# Patient Record
Sex: Female | Born: 1960 | Race: White | Hispanic: No | Marital: Married | Smoking: Never smoker
Health system: Southern US, Community
[De-identification: ages and names within clinical notes are randomized; demographics above are authoritative.]

## PROBLEM LIST (undated history)

## (undated) DIAGNOSIS — I10 Essential (primary) hypertension: Secondary | ICD-10-CM

## (undated) DIAGNOSIS — K219 Gastro-esophageal reflux disease without esophagitis: Secondary | ICD-10-CM

## (undated) DIAGNOSIS — E079 Disorder of thyroid, unspecified: Secondary | ICD-10-CM

## (undated) DIAGNOSIS — D751 Secondary polycythemia: Secondary | ICD-10-CM

## (undated) DIAGNOSIS — G4730 Sleep apnea, unspecified: Secondary | ICD-10-CM

## (undated) DIAGNOSIS — E039 Hypothyroidism, unspecified: Secondary | ICD-10-CM

## (undated) DIAGNOSIS — D649 Anemia, unspecified: Secondary | ICD-10-CM

## (undated) DIAGNOSIS — M1990 Unspecified osteoarthritis, unspecified site: Secondary | ICD-10-CM

## (undated) DIAGNOSIS — E785 Hyperlipidemia, unspecified: Secondary | ICD-10-CM

## (undated) DIAGNOSIS — J45909 Unspecified asthma, uncomplicated: Secondary | ICD-10-CM

## (undated) HISTORY — DX: Unspecified osteoarthritis, unspecified site: M19.90

## (undated) HISTORY — DX: Hyperlipidemia, unspecified: E78.5

## (undated) HISTORY — DX: Essential (primary) hypertension: I10

## (undated) HISTORY — DX: Sleep apnea, unspecified: G47.30

## (undated) HISTORY — PX: OTHER SURGICAL HISTORY: SHX169

## (undated) HISTORY — DX: Anemia, unspecified: D64.9

## (undated) HISTORY — DX: Disorder of thyroid, unspecified: E07.9

## (undated) HISTORY — PX: TUBAL LIGATION: SHX77

## (undated) HISTORY — DX: Secondary polycythemia: D75.1

---

## 2005-10-30 ENCOUNTER — Ambulatory Visit: Payer: Self-pay | Admitting: Occupational Therapy

## 2006-11-05 HISTORY — PX: COLONOSCOPY WITH ESOPHAGOGASTRODUODENOSCOPY (EGD): SHX5779

## 2006-11-27 ENCOUNTER — Ambulatory Visit: Payer: Self-pay | Admitting: Nurse Practitioner

## 2007-05-08 ENCOUNTER — Ambulatory Visit: Payer: Self-pay | Admitting: Gastroenterology

## 2007-05-26 ENCOUNTER — Ambulatory Visit: Payer: Self-pay | Admitting: Gastroenterology

## 2007-05-26 ENCOUNTER — Ambulatory Visit (HOSPITAL_COMMUNITY): Admission: RE | Admit: 2007-05-26 | Discharge: 2007-05-26 | Payer: Self-pay | Admitting: Gastroenterology

## 2007-05-26 ENCOUNTER — Encounter: Payer: Self-pay | Admitting: Gastroenterology

## 2007-12-18 ENCOUNTER — Ambulatory Visit: Payer: Self-pay | Admitting: Nurse Practitioner

## 2007-12-30 ENCOUNTER — Ambulatory Visit: Payer: Self-pay | Admitting: Nurse Practitioner

## 2008-12-24 ENCOUNTER — Ambulatory Visit: Payer: Self-pay | Admitting: Nurse Practitioner

## 2010-06-05 ENCOUNTER — Ambulatory Visit: Payer: Self-pay | Admitting: Nurse Practitioner

## 2011-03-20 NOTE — Op Note (Signed)
[name], Foley NO.:  [id number]   MEDICAL RECORD NO.:  [id number]          PATIENT TYPE:  AMB   LOCATION:  DAY                           FACILITY:  [facility]   PHYSICIAN:  Kassie Mends, M.D.      DATE OF BIRTH:  February 02, 1961   DATE OF PROCEDURE:  05/26/2007  DATE OF DISCHARGE:                               OPERATIVE REPORT   REFERRING Christine Foley:  Ninfa Linden, nurse practitioner, of  [place], [place].   PROCEDURES:  1. Ileocolonoscopy.  2. Esophagogastroduodenoscopy with biopsies of the esophagus, stomach      and duodenum.   INDICATION FOR EXAM:  Christine Foley is a 50 year old female who has a  reported history of pernicious anemia.  She presented with a hemoglobin  of 13.5 and an MCV of 77.  Her ferritin was 8.  She still menstruates  and has 3/5 days of heavy bleeding.  She has been taking ibuprofen 100  mg twice daily for back pain and occasionally takes Goody powders.   FINDINGS:  1. The scope advanced at approximately 10-20 cm into the distal      terminal ileum.  The terminal ileum was normal.  2. Normal colon without evidence of polyps, masses, inflammatory      changes, diverticula, AVMs or hemorrhoids.  3. Sessile polypoid-appearing lesion in the mid esophagus removed via      cold forceps.  4. Normal appearing gastric mucosa.  Biopsies obtained to evaluate for      atrophic gastritis as an etiology for her inability to absorb iron.  5. Normal duodenal bulb and second and third portion of the duodenum.      Biopsies obtained to evaluate for celiac sprue.   RECOMMENDATIONS:  1. No aspirin, NSAIDs or anticoagulation for five days.  2. Resume previous diet.  3. Continue iron supplementation.  4. Will call Christine Foley with results of her biopsies.  5. Screening colonoscopy in 10 years.   MEDICATIONS:  1. Demerol 100 mg IV.  2. Versed 8 mg IV.   PROCEDURE TECHNIQUE:  Physical exam was performed.  Informed consent was  obtained from  the patient explaining the benefits, risks and  alternatives to procedure.  The patient was connected to the monitor and  placed in the left lateral position.  Continuous oxygen was provided by  nasal cannula and IV medicine administered through an indwelling  cannula.  After administration of sedation and rectal exam, the  patient's rectum was intubated and the scope was advanced under direct  visualization to the terminal ileum.  The scope was withdrawn slowly by  carefully examining the color, texture, anatomy and integrity mucosa on  the way out.   After the colonoscopy, the patient's esophagus was intubated with a  diagnostic gastroscope.  The scope was advanced under direct  visualization to the third portion of the duodenum.  The scope was  withdrawn slowly by carefully examining the color, texture, anatomy and  integrity of the mucosa on the way out.  The patient was recovered in  endoscopy and discharged home in satisfactory condition.  Kassie Mends, M.D.  Electronically Signed     SM/MEDQ  D:  05/26/2007  T:  05/26/2007  Job:  [id number]   cc:   Ninfa Linden, N.P.  [place], [place]

## 2011-03-20 NOTE — Consult Note (Signed)
Christine Foley, Christine Foley             ACCOUNT NO.:  [id number]   MEDICAL RECORD NO.:  [id number]          PATIENT TYPE:  AMB   LOCATION:  DAY                           FACILITY:  [facility]   PHYSICIAN:  Kassie Mends, M.D.      DATE OF BIRTH:  04-May-1961   DATE OF CONSULTATION:  05/08/2007  DATE OF DISCHARGE:                                 CONSULTATION   REQUESTING PHYSICIAN:  Ninfa Linden, nurse practitioner.   CHIEF COMPLAINT:  Iron deficiency.   HISTORY OF PRESENT ILLNESS:  Christine Foley is a 50 year old female with a  1-year history of being diagnosed with anemia.  She was found to have  pernicious anemia and was started on B12 injections approximately 1 year  ago.  Her hemoglobin has normalized.  However, she continues to have  microcytic indices as well as a low serum ferritin of 8.  She has a  hemoglobin of 13.3 and a hematocrit 40.3 with an MCV of 77.  She denies  any problems with rectal bleeding or melena.  Denies any abdominal pain,  nausea or vomiting.  Her last menstrual period was June 23.  She does  have 3 out of 5 days with menorrhagia.  She has been on iron for the  last month.  She does have indigestion on a daily basis.  She takes over-  the-counter Tums for this.  She has not been on PPI recently, although  she did have an endoscopic evaluation approximately 4 years ago in  [place], [place], and tells me this was benign.  She denies any  history of blood donations.  She is not a vegetarian and does consume  meat.  She has been taking ibuprofen on a regular basis 800 mg b.i.d.  for chronic back pain.  She occasionally takes a Advertising account executive but not  with the ibuprofen.  She denies any dysphagia, odynophagia, anorexia or  early satiety.  Occasionally she will have constipation but denies any  diarrhea.   PAST MEDICAL AND SURGICAL HISTORY:  1. Chronic low back pain for almost 20 years now.  2. She has history of degenerative disk disease.  3. Hypertension.  4. GERD.  5. Sleep apnea.  6. Asthma.  7. She has had status post two C-sections.  8. She has had a right carpal tunnel release.   CURRENT MEDICATIONS:  1. Benazepril/HCl  20/12.5 mg daily.  2. Allegra 180 mg daily.  3. Albuterol inhaler p.r.n.  4. Iron 65 mg daily.  5. Ibuprofen 800 mg b.i.d.   ALLERGIES:  PENICILLIN.   FAMILY HISTORY:  There is no known family history of colorectal  carcinoma, liver or chronic GI problems, although she does report her  father, age 57, has a history of anemia.   SOCIAL HISTORY:  Christine Foley is married.  She presses uniforms.  She has  two grown healthy children.  She denies any tobacco or drug use.  She  rarely consumes alcohol.   REVIEW OF SYSTEMS:  CARDIOVASCULAR:  She does have palpitations.  Denies  any chest pain.  Otherwise negative review of systems.  See HPI.  PHYSICAL EXAM:  VITAL SIGNS:  Weight 249-1/2 pounds, height 59 inches.  Temperature 97.8, blood pressure 120/90, and pulse 76.  GENERAL:  Christine Foley is an obese Caucasian female who is alert  oriented, pleasant and cooperative, in no acute distress.  HEENT.  Sclerae are clear, nonicteric.  Conjunctivae pink.  Oropharynx pink and  moist without any lesions.  NECK:  Supple without any mass or  thyromegaly.  CHEST:  Heart regular rate and rhythm.  Normal S1-S2  without murmurs, rubs, thrills or gallops.  LUNGS:  Clear to  auscultation bilaterally.  ABDOMEN:  Protuberant with positive bowel  sounds x4.  No bruits auscultated.  Soft, nontender, nondistended,  without palpable mass or hepatosplenomegaly.  No rebound tenderness or  guarding.  Exam is limited given the patient's body habitus.  Rectal was deferred.  EXTREMITIES:  Without clubbing or edema  bilaterally.  SKIN:  Pink, warm and dry without rash or jaundice   IMPRESSION:  Christine Foley is a 50 year old female with history of low MCV  and low ferritin despite normal hemoglobin.  She has a history of B12  deficiency and has been on  monthly injections for approximately a year  now.  She has a normal TSH.  She appears to be iron-deficient and is  going to need a further workup to rule out occult malignancy or slow  gastrointestinal bleed.  She is on daily nonsteroidal anti-inflammatory  drug use, placing her at risk for gradual gastrointestinal bleeding.  She also has gastroesophageal reflux disease symptoms and is not on  proton pump inhibitor.   PLAN:  1. Begin Prilosec 20 mg daily.  2. Colonoscopy plus/minus EGD with Dr. Cira Servant in the near future.  I      have discussed this procedure with both the patient and Dr. Cira Servant      including risks and benefits, which include but are not limited to      bleeding, infection, perforation, and drug reaction.  She agrees.      A signed consent will be obtained.  She was told to hold her iron      for a week prior to the procedure.  No ibuprofen for 3 days prior      to the procedure.   Thank you, Ninfa Linden, nurse practitioner, for allowing [place] to  participate in the care of Christine Foley.      Lorenza Burton, N.P.      Kassie Mends, M.D.  Electronically Signed   KJ/MEDQ  D:  05/08/2007  T:  05/09/2007  Job:  [id number]   cc:   Ninfa Linden, NP  [place], [place]

## 2011-07-11 ENCOUNTER — Ambulatory Visit: Payer: Self-pay | Admitting: Nurse Practitioner

## 2011-08-20 LAB — FERRITIN: Ferritin: 27 (ref 10–291)

## 2012-07-14 ENCOUNTER — Ambulatory Visit: Payer: Self-pay | Admitting: Nurse Practitioner

## 2013-07-21 ENCOUNTER — Ambulatory Visit: Payer: Self-pay

## 2014-09-07 ENCOUNTER — Ambulatory Visit: Payer: Self-pay | Admitting: Physician Assistant

## 2015-03-21 ENCOUNTER — Encounter (HOSPITAL_COMMUNITY): Payer: Self-pay | Admitting: Oncology

## 2015-03-21 ENCOUNTER — Encounter (HOSPITAL_COMMUNITY): Payer: BLUE CROSS/BLUE SHIELD | Attending: Oncology | Admitting: Oncology

## 2015-03-21 VITALS — BP 120/83 | HR 84 | Temp 98.0°F | Resp 18 | Ht 58.5 in | Wt 265.4 lb

## 2015-03-21 DIAGNOSIS — N939 Abnormal uterine and vaginal bleeding, unspecified: Secondary | ICD-10-CM

## 2015-03-21 DIAGNOSIS — I10 Essential (primary) hypertension: Secondary | ICD-10-CM | POA: Diagnosis not present

## 2015-03-21 DIAGNOSIS — J45909 Unspecified asthma, uncomplicated: Secondary | ICD-10-CM | POA: Insufficient documentation

## 2015-03-21 DIAGNOSIS — Z6841 Body Mass Index (BMI) 40.0 and over, adult: Secondary | ICD-10-CM | POA: Insufficient documentation

## 2015-03-21 DIAGNOSIS — E039 Hypothyroidism, unspecified: Secondary | ICD-10-CM | POA: Diagnosis not present

## 2015-03-21 DIAGNOSIS — D751 Secondary polycythemia: Secondary | ICD-10-CM | POA: Insufficient documentation

## 2015-03-21 DIAGNOSIS — E785 Hyperlipidemia, unspecified: Secondary | ICD-10-CM | POA: Insufficient documentation

## 2015-03-21 LAB — COMPREHENSIVE METABOLIC PANEL
ALBUMIN: 4.2 g/dL (ref 3.5–5.0)
ALT: 21 U/L (ref 14–54)
AST: 25 U/L (ref 15–41)
Alkaline Phosphatase: 64 U/L (ref 38–126)
Anion gap: 11 (ref 5–15)
BILIRUBIN TOTAL: 1.1 mg/dL (ref 0.3–1.2)
BUN: 13 mg/dL (ref 6–20)
CO2: 28 mmol/L (ref 22–32)
Calcium: 9.2 mg/dL (ref 8.9–10.3)
Chloride: 99 mmol/L — ABNORMAL LOW (ref 101–111)
Creatinine, Ser: 0.7 mg/dL (ref 0.44–1.00)
GFR calc Af Amer: >60 mL/min (ref >60)
GFR calc non Af Amer: >60 mL/min (ref >60)
GLUCOSE: 106 mg/dL — AB (ref 65–99)
POTASSIUM: 3 mmol/L — AB (ref 3.5–5.1)
Sodium: 138 mmol/L (ref 135–145)
Total Protein: 7.4 g/dL (ref 6.5–8.1)

## 2015-03-21 LAB — IRON AND TIBC
Iron: 48 ug/dL (ref 28–170)
Saturation Ratios: 14 % (ref 10.4–31.8)
TIBC: 337 ug/dL (ref 250–450)
UIBC: 289 ug/dL

## 2015-03-21 LAB — TSH: TSH: 1.475 u[IU]/mL (ref 0.350–4.500)

## 2015-03-21 LAB — VITAMIN B12: Vitamin B-12: 859 pg/mL (ref 180–914)

## 2015-03-21 LAB — FERRITIN: Ferritin: 118 ng/mL (ref 11–307)

## 2015-03-21 NOTE — Patient Instructions (Signed)
[place] at [facility] Discharge Instructions  RECOMMENDATIONS MADE BY THE CONSULTANT AND ANY TEST RESULTS WILL BE SENT TO YOUR REFERRING PHYSICIAN.  Exam and discussion by Robynn Pane, PA-C and Dr. Ancil Linsey Will check some labs. Make referrals for: Sleep Study Gynecologic consult Will see you back in 3 - 4 weeks to discuss results.   Thank you for choosing [place] at [facility] to provide your oncology and hematology care.  To afford each patient quality time with our provider, please arrive at least 15 minutes before your scheduled appointment time.    You need to re-schedule your appointment should you arrive 10 or more minutes late.  We strive to give you quality time with our providers, and arriving late affects you and other patients whose appointments are after yours.  Also, if you no show three or more times for appointments you may be dismissed from the clinic at the providers discretion.     Again, thank you for choosing [facility].  Our hope is that these requests will decrease the amount of time that you wait before being seen by our physicians.       _____________________________________________________________  Should you have questions after your visit to [facility], please contact our office at [phone] between the hours of 8:30 a.m. and 4:30 p.m.  Voicemails left after 4:30 p.m. will not be returned until the following business day.  For prescription refill requests, have your pharmacy contact our office.

## 2015-03-21 NOTE — Progress Notes (Addendum)
[facility] Hematology/Oncology Consultation   Name: VALERA VALLAS      MRN: [id number]    Location: Room/bed info not found  Date: 03/21/2015 Time:4:05 PM   REFERRING PHYSICIAN:  Karna Dupes, PA-C; [place] CONSULT:  Polycythemia   DIAGNOSIS:  Polycythemia  HISTORY OF PRESENT ILLNESS:   Mrs. Sumler is a 54 year old white American female with a past medical history significant for asthma, seasonal allergies, HTN, Hyperlipidemia, Hypothyroidism, history of iron deficiency anemia, and left knee arthritis who was referred to the [facility] for an elevated hemoglobin.  The patient reports that she saw Mr. Alford Highland, PA-C for a 1 week history of dizziness without episodes of LOC.  This was complicated by hot flashes and other constitutional symptoms including headaches and sinus pressure behind her eyes.  Her labs demonstrated an elevated Hgb and she was provided some education regarding polycythemia and once she learned about it, her symptoms spontaneously resolved.    Interestingly, she notes a 3 month history of vaginal bleeding ranging from vaginal spotting to heavy bleeding with clots.  She notes that her vaginal bleeding has been worse in the past months.  She has not seen a Editor, commissioning about this.  She reports that her last internal vaginal exam was 3 years ago by her primary care provider.  At that time, she reports that her exam was benign.    She notes that 8 years ago, she was iron deficient and was started on PO iron.  She is married and has been married for 20 years.  She admits that her husband reports that she snores.  She notes that's she does not feel rested upon waking in the AM.  She denies that her husband reports that she does not stop breathing during sleep.  I personally reviewed and went over laboratory results with the patient.  The results are noted within this dictation.  I personally reviewed and went over  radiographic studies with the patient.  The results are noted within this dictation.    She reports that she is up-to-date on colonoscopy and mammogram.   PAST MEDICAL HISTORY:   Past Medical History  Diagnosis Date  . Arthritis     ALLERGIES: Allergies  Allergen Reactions  . Penicillins Shortness Of Breath      MEDICATIONS: I have reviewed the patient's current medications.    No current outpatient prescriptions on file prior to visit.   No current facility-administered medications on file prior to visit.     PAST SURGICAL HISTORY Past Surgical History  Procedure Laterality Date  . Cesarean section      x 2    FAMILY HISTORY: Family History  Problem Relation Age of Onset  . Diabetes Mother   . Hypertension Father     SOCIAL HISTORY:  reports that she has never smoked. She has never used smokeless tobacco. She reports that she does not drink alcohol or use illicit drugs.  PERFORMANCE STATUS: The patient's performance status is 1 - Symptomatic but completely ambulatory  PHYSICAL EXAM: Most Recent Vital Signs: Blood pressure 120/83, pulse 84, temperature 98 F (36.7 C), temperature source Oral, resp. rate 18, height 4' 10.5" (1.486 m), weight 265 lb 6.4 oz (120.385 kg), SpO2 99 %. General appearance: alert, cooperative, appears stated age, no distress and morbidly obese Head: Normocephalic, without obvious abnormality, atraumatic Eyes: conjunctivae/corneas clear. PERRL, EOM's intact. Fundi benign. Throat: lips, mucosa, and tongue normal;  teeth and gums normal Neck: no adenopathy, supple, symmetrical, trachea midline and thyroid not enlarged, symmetric, no tenderness/mass/nodules Lungs: clear to auscultation bilaterally and normal percussion bilaterally Heart: regular rate and rhythm, S1, S2 normal, no murmur, click, rub or gallop Abdomen: normal findings: bowel sounds normal, no masses palpable, no organomegaly, soft, non-tender, spleen non-palpable, symmetric and  umbilicus normal and abnormal findings:  obese Extremities: extremities normal, atraumatic, no cyanosis or edema Skin: Skin color, texture, turgor normal. No rashes or lesions Lymph nodes: Cervical, supraclavicular, and axillary nodes normal. Neurologic: Alert and oriented X 3, normal strength and tone. Normal symmetric reflexes. Normal coordination and gait  LABORATORY DATA:  No results found for this or any previous visit (from the past 48 hour(s)).    RADIOGRAPHY: No results found.     PATHOLOGY:  Nothing  ASSESSMENT:  1. Polycythemia with elevated HCT, mild. 2. Vaginal bleeding, x 3 months 3. HTN 4. Hyperlipidemia 5. Asthma 6. Seasonal allergies 7. Morbid obesity 8. Hypothyroidism   PLAN:  1. I personally reviewed and went over laboratory results with the patient.  The results are noted within this dictation. 2. Chart reviewed 3. Labs: CBC diff, CMET, EPO, B12, iron/TIBC, ferritin, TSH, JAK2, and JAK2 Exon 12 and 13. 4. Sleep study 5. Referral to gynecologist for 3 month history of heavy bleeding. 6. Return in 3-4 weeks for follow-up.  All questions were answered. The patient knows to call the clinic with any problems, questions or concerns. We can certainly see the patient much sooner if necessary.  More than 50% of the time spent with the patient was utilized for counseling and coordination of care.  This note is electronically signed by: Robynn Pane 03/21/2015 4:05 PM   Patient is seen and examined. Details are as above.She will be referred for a sleep study given history and body habitus. Given her gynecologic complains she will be referred to gynecology.  We will repeat labs today and send off additional studies as detailed above. She does not have symptoms c/w primary P. Vera. I will see her back once all results are available for additional recommendations and review.  Donald Pore MD

## 2015-03-24 ENCOUNTER — Other Ambulatory Visit (HOSPITAL_COMMUNITY): Payer: Self-pay

## 2015-03-24 ENCOUNTER — Encounter (HOSPITAL_COMMUNITY): Payer: BLUE CROSS/BLUE SHIELD | Attending: Hematology & Oncology

## 2015-03-24 DIAGNOSIS — D751 Secondary polycythemia: Secondary | ICD-10-CM | POA: Diagnosis not present

## 2015-03-24 DIAGNOSIS — D582 Other hemoglobinopathies: Secondary | ICD-10-CM | POA: Diagnosis not present

## 2015-03-24 LAB — CBC WITH DIFFERENTIAL/PLATELET
BASOS ABS: 0 10*3/uL (ref 0.0–0.1)
Basophils Relative: 0 % (ref 0–1)
EOS ABS: 0.3 10*3/uL (ref 0.0–0.7)
Eosinophils Relative: 3 % (ref 0–5)
HEMATOCRIT: 44.5 % (ref 36.0–46.0)
HEMOGLOBIN: 14.8 g/dL (ref 12.0–15.0)
LYMPHS ABS: 2.3 10*3/uL (ref 0.7–4.0)
Lymphocytes Relative: 29 % (ref 12–46)
MCH: 28.3 pg (ref 26.0–34.0)
MCHC: 33.3 g/dL (ref 30.0–36.0)
MCV: 85.1 fL (ref 78.0–100.0)
Monocytes Absolute: 0.4 10*3/uL (ref 0.1–1.0)
Monocytes Relative: 5 % (ref 3–12)
NEUTROS PCT: 63 % (ref 43–77)
Neutro Abs: 5 10*3/uL (ref 1.7–7.7)
Platelets: 302 10*3/uL (ref 150–400)
RBC: 5.23 MIL/uL — AB (ref 3.87–5.11)
RDW: 12.9 % (ref 11.5–15.5)
WBC: 8 10*3/uL (ref 4.0–10.5)

## 2015-03-24 NOTE — Progress Notes (Signed)
Labs drawn

## 2015-03-25 LAB — ERYTHROPOIETIN: Erythropoietin: 12.3 m[IU]/mL (ref 2.6–18.5)

## 2015-04-01 LAB — MISCELLANEOUS TEST

## 2015-04-08 ENCOUNTER — Ambulatory Visit: Payer: BLUE CROSS/BLUE SHIELD | Attending: Oncology | Admitting: Sleep Medicine

## 2015-04-08 DIAGNOSIS — D751 Secondary polycythemia: Secondary | ICD-10-CM

## 2015-04-08 DIAGNOSIS — G4733 Obstructive sleep apnea (adult) (pediatric): Secondary | ICD-10-CM | POA: Insufficient documentation

## 2015-04-08 DIAGNOSIS — E669 Obesity, unspecified: Secondary | ICD-10-CM | POA: Insufficient documentation

## 2015-04-08 DIAGNOSIS — R0683 Snoring: Secondary | ICD-10-CM | POA: Diagnosis present

## 2015-04-08 DIAGNOSIS — Z6841 Body Mass Index (BMI) 40.0 and over, adult: Secondary | ICD-10-CM | POA: Diagnosis not present

## 2015-04-11 NOTE — Sleep Study (Signed)
  [place] A. Merlene Laughter, MD     [email or web address]        NOCTURNAL POLYSOMNOGRAM    LOCATION: SLEEP LAB FACILITY: [facility]   PHYSICIAN: Micael Barb A. Merlene Laughter, M.D.   DATE OF STUDY: 04/08/2015.   REFERRING PHYSICIAN: Robynn Pane, PA-C.   INDICATIONS: The patient is a 54 year old who presents with snoring, fatigue and obesity.  MEDICATIONS:  Prior to Admission medications   Medication  Start Date End Date Taking? Authorizing Provider  ALBUTEROL IN Inhale into the lungs as needed.    Historical Provider, MD  benazepril (LOTENSIN) 20 MG tablet Take 20 mg by mouth daily. 03/13/15   Historical Provider, MD  cetirizine (ZYRTEC) 10 MG tablet Take 10 mg by mouth at bedtime as needed for allergies.    Historical Provider, MD  Cholecalciferol (VITAMIN D3) 5000 UNITS CAPS Take 5,000 Units by mouth daily.    Historical Provider, MD  Ferrous Sulfate (IRON) 325 (65 FE) MG TABS Take 65 mg by mouth daily.    Historical Provider, MD  fluticasone (FLONASE) 50 MCG/ACT nasal spray Place 1 spray into both nostrils daily. 02/04/15   Historical Provider, MD  hydrochlorothiazide (HYDRODIURIL) 25 MG tablet Take 25 mg by mouth daily. 01/10/15   Historical Provider, MD  ibuprofen (ADVIL,MOTRIN) 800 MG tablet Take 800 mg by mouth as needed. 01/10/15   Historical Provider, MD  levothyroxine (SYNTHROID, LEVOTHROID) 75 MCG tablet Take 75 mcg by mouth daily. 01/06/15   Historical Provider, MD  Melatonin 5 MG TABS Take 1 tablet by mouth at bedtime.    Historical Provider, MD  omeprazole (PRILOSEC) 20 MG capsule Take 20 mg by mouth daily.    Historical Provider, MD  pravastatin (PRAVACHOL) 20 MG tablet Take 20 mg by mouth daily. 01/06/15   Historical Provider, MD      EPWORTH SLEEPINESS SCALE: 1.   BMI: 55.   ARCHITECTURAL SUMMARY: Total recording time was 474 minutes. Sleep efficiency 57 %. Sleep latency 21 minutes. REM latency 340 minutes. Stage NI 16 %, N2 56 % and N3 15 % and REM sleep 13 %.     RESPIRATORY DATA:  Baseline oxygen saturation is 97 %. The lowest saturation is 79 %. The diagnostic AHI is 20. The RDI is 20. The REM AHI is 63.  LIMB MOVEMENT SUMMARY: PLM index 1.   ELECTROCARDIOGRAM SUMMARY: Average heart rate is 82 with no significant dysrhythmias observed.   IMPRESSION:  1. Moderate obstructive sleep apnea syndrome worse during REM sleep. Formal CPAP titration recording is suggested.  Thanks for this referral.  Amaury Kuzel A. Merlene Laughter, M.D. Diplomat, Tax adviser of Sleep Medicine.

## 2015-04-12 ENCOUNTER — Ambulatory Visit (INDEPENDENT_AMBULATORY_CARE_PROVIDER_SITE_OTHER): Payer: BLUE CROSS/BLUE SHIELD | Admitting: Obstetrics and Gynecology

## 2015-04-12 ENCOUNTER — Other Ambulatory Visit: Payer: Self-pay | Admitting: Obstetrics and Gynecology

## 2015-04-12 ENCOUNTER — Encounter: Payer: Self-pay | Admitting: Obstetrics and Gynecology

## 2015-04-12 VITALS — BP 118/78 | HR 80 | Ht 58.75 in | Wt 265.6 lb

## 2015-04-12 DIAGNOSIS — N950 Postmenopausal bleeding: Secondary | ICD-10-CM | POA: Diagnosis not present

## 2015-04-12 DIAGNOSIS — Z3202 Encounter for pregnancy test, result negative: Secondary | ICD-10-CM

## 2015-04-12 DIAGNOSIS — N924 Excessive bleeding in the premenopausal period: Secondary | ICD-10-CM | POA: Insufficient documentation

## 2015-04-12 DIAGNOSIS — N939 Abnormal uterine and vaginal bleeding, unspecified: Secondary | ICD-10-CM | POA: Diagnosis not present

## 2015-04-12 LAB — POCT URINE PREGNANCY: Preg Test, Ur: NEGATIVE

## 2015-04-12 NOTE — Progress Notes (Signed)
Patient ID: Christine Foley, female   DOB: 02/06/1961, 54 y.o.   MRN: [id number]   [facility] Visit  Patient name: Christine Foley MRN [id number]  Date of birth: 1961-08-13  CC & HPI:  Christine Foley is a 54 y.o. female presenting today for perimenopausal irregular bleeding.   Skipped 11 months at age 89, then had menses x1 then 4 months amenorrhea, has bled almost daily since Feb, not much ROS:  LMP Feb 15 , hasn't stopped since. Has bled off and on since.   Pertinent History Reviewed:   Reviewed: Significant for last pap 2013, always normal paps.  Medical         Past Medical History  Diagnosis Date  . Arthritis                               Surgical Hx:    Past Surgical History  Procedure Laterality Date  . Cesarean section      x 2  No obstetric history on file. G2P2  Medications : Reviewed & Updated - see associated section                       Current outpatient prescriptions:  .  ALBUTEROL IN, Inhale into the lungs as needed., Disp: , Rfl:  .  benazepril (LOTENSIN) 20 MG tablet, Take 20 mg by mouth daily., Disp: , Rfl: 1 .  cetirizine (ZYRTEC) 10 MG tablet, Take 10 mg by mouth at bedtime as needed for allergies., Disp: , Rfl:  .  Cholecalciferol (VITAMIN D3) 5000 UNITS CAPS, Take 5,000 Units by mouth daily., Disp: , Rfl:  .  Ferrous Sulfate (IRON) 325 (65 FE) MG TABS, Take 65 mg by mouth daily., Disp: , Rfl:  .  fluticasone (FLONASE) 50 MCG/ACT nasal spray, Place 1 spray into both nostrils daily., Disp: , Rfl: 0 .  hydrochlorothiazide (HYDRODIURIL) 25 MG tablet, Take 25 mg by mouth daily., Disp: , Rfl: 0 .  ibuprofen (ADVIL,MOTRIN) 800 MG tablet, Take 800 mg by mouth as needed., Disp: , Rfl: 0 .  levothyroxine (SYNTHROID, LEVOTHROID) 75 MCG tablet, Take 75 mcg by mouth daily., Disp: , Rfl: 0 .  omeprazole (PRILOSEC) 20 MG capsule, Take 20 mg by mouth daily., Disp: , Rfl:  .  pravastatin (PRAVACHOL) 20 MG tablet, Take 20 mg by mouth daily., Disp: , Rfl:  0 .  Melatonin 5 MG TABS, Take 1 tablet by mouth at bedtime., Disp: , Rfl:    Social History: Reviewed -  reports that she has never smoked. She has never used smokeless tobacco.  Objective Findings:  Vitals: Blood pressure 118/78, pulse 80, height 4' 10.75" (1.492 m), weight 265 lb 9.6 oz (120.475 kg), last menstrual period 12/20/2014.  Physical Examination: General appearance - alert, well appearing, and in no distress, oriented to person, place, and time and overweight Mental status - alert, oriented to person, place, and time, normal mood, behavior, speech, dress, motor activity, and thought processes Abdomen - soft, nontender, nondistended, no masses or organomegaly Pelvic - normal external genitalia, vulva, vagina, cervix, uterus and adnexa, VULVA: normal appearing vulva with no masses, tenderness or lesions,  , VAGINA: normal appearing vagina with normal color and discharge, no lesions, PELVIC FLOOR EXAM: no cystocele, rectocele or prolapse noted Good support.  PROCEDURE NOTE: TIMEOUT AND CONSENTED FOR ENDOMETRIAL BIOPSY. UTERUS SOUNDED TO 9 CM, WITH BLOOD AND SOME TISSUE CHUNKS  OBTAINED. It feels like there may be a polyp..   Assessment & Plan:   A:  1. POstmenopausal bleeding 2 Hx chronic anovulation  P:  1. F/u endo bx 2. Order pelvic u/s 3 review results at u/s appt

## 2015-04-20 ENCOUNTER — Ambulatory Visit (INDEPENDENT_AMBULATORY_CARE_PROVIDER_SITE_OTHER): Payer: BLUE CROSS/BLUE SHIELD

## 2015-04-20 ENCOUNTER — Encounter: Payer: Self-pay | Admitting: Obstetrics and Gynecology

## 2015-04-20 ENCOUNTER — Ambulatory Visit (INDEPENDENT_AMBULATORY_CARE_PROVIDER_SITE_OTHER): Payer: BLUE CROSS/BLUE SHIELD | Admitting: Obstetrics and Gynecology

## 2015-04-20 ENCOUNTER — Other Ambulatory Visit: Payer: BLUE CROSS/BLUE SHIELD

## 2015-04-20 VITALS — BP 124/78 | Ht <= 58 in | Wt 264.5 lb

## 2015-04-20 DIAGNOSIS — N924 Excessive bleeding in the premenopausal period: Secondary | ICD-10-CM | POA: Diagnosis not present

## 2015-04-20 DIAGNOSIS — N888 Other specified noninflammatory disorders of cervix uteri: Secondary | ICD-10-CM

## 2015-04-20 DIAGNOSIS — R938 Abnormal findings on diagnostic imaging of other specified body structures: Secondary | ICD-10-CM | POA: Diagnosis not present

## 2015-04-20 DIAGNOSIS — N854 Malposition of uterus: Secondary | ICD-10-CM | POA: Diagnosis not present

## 2015-04-20 DIAGNOSIS — N950 Postmenopausal bleeding: Secondary | ICD-10-CM

## 2015-04-20 MED ORDER — MEDROXYPROGESTERONE ACETATE 5 MG PO TABS: 5.0000 mg | ORAL_TABLET | Freq: Every day | ORAL | Status: DC

## 2015-04-20 NOTE — Progress Notes (Signed)
Patient ID: Christine Foley, female   DOB: 1960/12/15, 54 y.o.   MRN: [id number] Pt here today for follow up and to get results from biopsy and [place].  Biopsy: proliferative endometrium U/s poor u/s quality due to the scar from the c/s causing shadowing Adnexa negative' U/s ?thickened to 11 mm of endometrial area.  BP 124/78 mmHg  Ht 4\' 10"  (1.473 m)  Wt 264 lb 8 oz (119.976 kg)  BMI 55.30 kg/m2  LMP 12/20/2014 . Assess perimenopausal bleeding with negative endo bx Plan" provera 5 x 14 d/ month x 3 months then review menstrual calendar. Reassess in October. Consider hysteroscopy for suspected polyp if bleeding persists.

## 2015-04-20 NOTE — Progress Notes (Addendum)
[place] T/V pelvic:  anteverted uterus wnl,simple nabothian cyst 1.4 x 1.4 x 1.2cm,thickened EEC 14MM,limited view because of pt body habitus,unable to see rt ov, ? lt ov (limited view) wnl

## 2015-04-21 ENCOUNTER — Encounter (HOSPITAL_COMMUNITY): Payer: BLUE CROSS/BLUE SHIELD | Attending: Hematology & Oncology | Admitting: Hematology & Oncology

## 2015-04-21 ENCOUNTER — Encounter (HOSPITAL_COMMUNITY): Payer: Self-pay | Admitting: Hematology & Oncology

## 2015-04-21 VITALS — BP 94/63 | HR 84 | Temp 98.4°F | Resp 16 | Wt 263.2 lb

## 2015-04-21 DIAGNOSIS — E039 Hypothyroidism, unspecified: Secondary | ICD-10-CM | POA: Diagnosis not present

## 2015-04-21 DIAGNOSIS — N939 Abnormal uterine and vaginal bleeding, unspecified: Secondary | ICD-10-CM | POA: Diagnosis not present

## 2015-04-21 DIAGNOSIS — I10 Essential (primary) hypertension: Secondary | ICD-10-CM

## 2015-04-21 DIAGNOSIS — D751 Secondary polycythemia: Secondary | ICD-10-CM

## 2015-04-21 DIAGNOSIS — E876 Hypokalemia: Secondary | ICD-10-CM | POA: Diagnosis not present

## 2015-04-21 DIAGNOSIS — N924 Excessive bleeding in the premenopausal period: Secondary | ICD-10-CM

## 2015-04-21 NOTE — Patient Instructions (Addendum)
[place] at [facility] Discharge Instructions  RECOMMENDATIONS MADE BY THE CONSULTANT AND ANY TEST RESULTS WILL BE SENT TO YOUR REFERRING PHYSICIAN.  Exam and discussion by Dr. Whitney Muse Will make a referral to Dr. Merlene Laughter for your abnormal sleep study.  Follow-up in 3 months with labs and office visit.  Thank you for choosing [place] at [facility] to provide your oncology and hematology care.  To afford each patient quality time with our provider, please arrive at least 15 minutes before your scheduled appointment time.    You need to re-schedule your appointment should you arrive 10 or more minutes late.  We strive to give you quality time with our providers, and arriving late affects you and other patients whose appointments are after yours.  Also, if you no show three or more times for appointments you may be dismissed from the clinic at the providers discretion.     Again, thank you for choosing [facility].  Our hope is that these requests will decrease the amount of time that you wait before being seen by our physicians.       _____________________________________________________________  Should you have questions after your visit to [facility], please contact our office at [phone] between the hours of 8:30 a.m. and 4:30 p.m.  Voicemails left after 4:30 p.m. will not be returned until the following business day.  For prescription refill requests, have your pharmacy contact our office.

## 2015-04-21 NOTE — Progress Notes (Signed)
[facility] Hematology/Oncology Progress Note  Name: Christine Foley      MRN: [id number]    Location: Room/bed info not found  Date: 04/21/2015 Time:9:16 AM   REFERRING PHYSICIAN:  Karna Dupes, PA-C; [place] CONSULT:  Polycythemia   DIAGNOSIS:  Polycythemia  HISTORY OF PRESENT ILLNESS:   Christine Foley is a 54 year old white American female with a past medical history significant for asthma, seasonal allergies, HTN, Hyperlipidemia, Hypothyroidism, history of iron deficiency anemia, and left knee arthritis who was referred to the [facility] for an elevated hemoglobin.  She is here today to review studies obtained since her last visit including laboratory tests. She presents alone today and is feeling fine.  Appointment on 10/16, Dr. Glo Herring of ob/gyn in regards to her vaginal "spotting"   PAST MEDICAL HISTORY:   Past Medical History  Diagnosis Date  . Arthritis     ALLERGIES: Allergies  Allergen Reactions  . Penicillins Shortness Of Breath      MEDICATIONS: I have reviewed the patient's current medications.    Current Outpatient Prescriptions on File Prior to Visit  Medication Sig Dispense Refill  . ALBUTEROL IN Inhale into the lungs as needed.    . benazepril (LOTENSIN) 20 MG tablet Take 20 mg by mouth daily.  1  . cetirizine (ZYRTEC) 10 MG tablet Take 10 mg by mouth at bedtime as needed for allergies.    . Cholecalciferol (VITAMIN D3) 5000 UNITS CAPS Take 5,000 Units by mouth daily.    . Ferrous Sulfate (IRON) 325 (65 FE) MG TABS Take 65 mg by mouth daily.    . fluticasone (FLONASE) 50 MCG/ACT nasal spray Place 1 spray into both nostrils daily.  0  . hydrochlorothiazide (HYDRODIURIL) 25 MG tablet Take 25 mg by mouth daily.  0  . ibuprofen (ADVIL,MOTRIN) 800 MG tablet Take 800 mg by mouth as needed.  0  . levothyroxine (SYNTHROID, LEVOTHROID) 75 MCG tablet Take 75 mcg by mouth daily.  0  . medroxyPROGESTERone  (PROVERA) 5 MG tablet Take 1 tablet (5 mg total) by mouth daily. One tablet daily x 2 weeks. Repeat as directed 42 tablet 1  . Melatonin 5 MG TABS Take 1 tablet by mouth at bedtime.    Marland Kitchen omeprazole (PRILOSEC) 20 MG capsule Take 20 mg by mouth daily.    . pravastatin (PRAVACHOL) 20 MG tablet Take 20 mg by mouth daily.  0   No current facility-administered medications on file prior to visit.     PAST SURGICAL HISTORY Past Surgical History  Procedure Laterality Date  . Cesarean section      x 2    FAMILY HISTORY: Family History  Problem Relation Age of Onset  . Diabetes Mother   . Hypertension Father     SOCIAL HISTORY:  reports that she has never smoked. She has never used smokeless tobacco. She reports that she does not drink alcohol or use illicit drugs.  14 point review of systems was performed and is negative except as detailed under history of present illness and above   PERFORMANCE STATUS: The patient's performance status is 1 - Symptomatic but completely ambulatory   PHYSICAL EXAM: Most Recent Vital Signs:  Filed Vitals:   04/21/15 1300  BP: 94/63  Pulse: 84  Temp: 98.4 F (36.9 C)  Resp: 16   General appearance: alert, cooperative, appears stated age, no distress and morbidly obese Head: Normocephalic, without obvious abnormality,  atraumatic Eyes: conjunctivae/corneas clear. PERRL, EOM's intact. Fundi benign. Throat: lips, mucosa, and tongue normal; teeth and gums normal Neck: no adenopathy, supple, symmetrical, trachea midline and thyroid not enlarged, symmetric, no tenderness/mass/nodules Lungs: clear to auscultation bilaterally and normal percussion bilaterally Heart: regular rate and rhythm, S1, S2 normal, no murmur, click, rub or gallop Abdomen: normal findings: bowel sounds normal, no masses palpable, no organomegaly, soft, non-tender, spleen non-palpable, symmetric and umbilicus normal and abnormal findings:  obese Extremities: extremities normal,  atraumatic, no cyanosis or edema Skin: Skin color, texture, turgor normal. No rashes or lesions Lymph nodes: Cervical, supraclavicular, and axillary nodes normal. Neurologic: Alert and oriented X 3, normal strength and tone. Normal symmetric reflexes. Normal coordination and gait  LABORATORY DATA:  Results for Christine Foley (MRN [id number]) as of 05/06/2015 17:33  Ref. Range 03/24/2015 13:03  WBC Latest Ref Range: 4.0-10.5 K/uL 8.0  RBC Latest Ref Range: 3.87-5.11 MIL/uL 5.23 (H)  Hemoglobin Latest Ref Range: 12.0-15.0 g/dL 14.8  HCT Latest Ref Range: 36.0-46.0 % 44.5  MCV Latest Ref Range: 78.0-100.0 fL 85.1  MCH Latest Ref Range: 26.0-34.0 pg 28.3  MCHC Latest Ref Range: 30.0-36.0 g/dL 33.3  RDW Latest Ref Range: 11.5-15.5 % 12.9  Platelets Latest Ref Range: 150-400 K/uL 302  Neutrophils Latest Ref Range: 43-77 % 63  Lymphocytes Latest Ref Range: 12-46 % 29  Monocytes Relative Latest Ref Range: 3-12 % 5  Eosinophil Latest Ref Range: 0-5 % 3  Basophil Latest Ref Range: 0-1 % 0  NEUT# Latest Ref Range: 1.7-7.7 K/uL 5.0  Lymphocyte # Latest Ref Range: 0.7-4.0 K/uL 2.3  Monocyte # Latest Ref Range: 0.1-1.0 K/uL 0.4  Eosinophils Absolute Latest Ref Range: 0.0-0.7 K/uL 0.3  Basophils Absolute Latest Ref Range: 0.0-0.1 K/uL 0.0  WBC Morphology Unknown ATYPICAL LYMPHOCYTES  Erythropoietin Latest Ref Range: 2.6-18.5 mIU/mL 12.3  Miscellaneous Test Unknown JAK2 MUT.QUAL RFL...  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  LMP 12/20/2014 for a pelvic sonogram for  post menopausal bleeding since febuary.  Uterus                      9.6 x 5.3 x 5.1 cm, wnl, 1.4 x 1.4 x 1.2 cm  simple nabothian cyst  Endometrium          14 mm, symmetrical, thickened (limited view)  Right ovary              Unable to see rt ov,no adnexal  masses or cysts  seen  Left ovary                2.8 x 2.1 x 1.14 cm, ? Lt ov  (limited view)    Technician Comments:   [name] 04/20/2015 11:45 AM        ASSESSMENT:  1. Polycythemia with elevated HCT, mild. 2. Vaginal bleeding, x 3 months 3. HTN 4. Hyperlipidemia 5. Asthma 6. Seasonal allergies 7. Morbid obesity 8. Hypothyroidism 9. Hypokalemia   PLAN:  Laboratory studies were reviewed with the patient. I advised her that I suspect she has a mild elevation in hemoglobin and hematocrit from her sleep apnea. I will find out who to refer the patient to for additional treatment of her sleep apnea. I advised her that based on her prior laboratory studies she  needs to increase her calcium intake and also start vitamin D. I recommended 1200 mg daily of calcium and 1000 2000 units of vitamin D daily.  She has additional follow-up with Dr. Glo Herring. She will contact her primary care doctor in regards to her low potassium. We have also forwarded a copy of her CMP to her primary care provider.  Atypical lymphocytes were reported on her last CBC with differential. Smear review was relatively benign. I recommended repeating a CBC and smear review at her follow-up in 3 months.  All questions were answered. The patient knows to call the clinic with any problems, questions or concerns. We can certainly see the patient much sooner if necessary.   This note is electronically signed.  This document serves as a record of services personally performed by Ancil Linsey, MD. It was created on her behalf by Janace Hoard, a trained medical scribe. The creation of this record is based on the scribe's personal observations and the provider's statements to them. This document has been checked and approved by the attending provider.  I have reviewed the above documentation for accuracy and completeness, and I agree with the above.  Shanon K. Whitney Muse, MD

## 2015-04-29 ENCOUNTER — Encounter (HOSPITAL_COMMUNITY): Payer: Self-pay | Admitting: Lab

## 2015-04-29 NOTE — Progress Notes (Signed)
Referral sent to Dr Merlene Laughter.  Records faxed on 6/24

## 2015-05-02 ENCOUNTER — Telehealth: Payer: Self-pay | Admitting: Obstetrics and Gynecology

## 2015-05-02 NOTE — Telephone Encounter (Signed)
Pt states that she took the provera for the two weeks that Dr. Glo Herring advised and she started bleeding about 4-5 days ago and she is bleeding very heavy. Pt states that she is having to change her pad at least every hour. Pt states that she is having a little bit of cramping. Pt was advised that due to her heavy bleeding that she would need to see Dr. Glo Herring and discuss this with him. Pt verbalized understanding and phone call was switched up front to make appointment

## 2015-05-05 ENCOUNTER — Encounter: Payer: Self-pay | Admitting: Obstetrics and Gynecology

## 2015-05-05 ENCOUNTER — Ambulatory Visit (INDEPENDENT_AMBULATORY_CARE_PROVIDER_SITE_OTHER): Payer: BLUE CROSS/BLUE SHIELD | Admitting: Obstetrics and Gynecology

## 2015-05-05 VITALS — BP 102/68 | HR 80 | Ht 59.0 in | Wt 262.6 lb

## 2015-05-05 DIAGNOSIS — N924 Excessive bleeding in the premenopausal period: Secondary | ICD-10-CM

## 2015-05-05 DIAGNOSIS — N939 Abnormal uterine and vaginal bleeding, unspecified: Secondary | ICD-10-CM

## 2015-05-05 LAB — POCT HEMOGLOBIN: Hemoglobin: 12.5 g/dL (ref 12.2–16.2)

## 2015-05-05 NOTE — Progress Notes (Addendum)
[facility] Visit  Patient name: Christine Foley MRN [id number]  Date of birth: 1961-04-29  CC & HPI:  Christine Foley is a 54 y.o. female presenting today for follow-up of abnormal perimenopausal bleeding. She states she has 1 very heavy menstrual cycle since she started Provera 2 weeks ago, with "blood clots as big as my hand." she was to use Provera monthly x 3 months and instead came in early due to concern over heaviness of first menses. She had a hemoglobin stick here today, with a value of 12.5.   ROS:  A complete review of systems was obtained and all systems are negative except as noted in the HPI and PMH.    Pertinent History Reviewed:   Reviewed: Significant for Cesarean section x 2 Medical         Past Medical History  Diagnosis Date  . Arthritis                               Surgical Hx:    Past Surgical History  Procedure Laterality Date  . Cesarean section      x 2   Medications: Reviewed & Updated - see associated section                       Current outpatient prescriptions:  .  ALBUTEROL IN, Inhale into the lungs as needed., Disp: , Rfl:  .  benazepril (LOTENSIN) 20 MG tablet, Take 20 mg by mouth daily., Disp: , Rfl: 1 .  calcium carbonate (OS-CAL) 600 MG TABS tablet, Take 600 mg by mouth 2 (two) times daily with a meal., Disp: , Rfl:  .  cetirizine (ZYRTEC) 10 MG tablet, Take 10 mg by mouth at bedtime as needed for allergies., Disp: , Rfl:  .  Cholecalciferol (VITAMIN D3) 5000 UNITS CAPS, Take 1,000 Units by mouth daily. , Disp: , Rfl:  .  fluticasone (FLONASE) 50 MCG/ACT nasal spray, Place 1 spray into both nostrils daily., Disp: , Rfl: 0 .  hydrochlorothiazide (HYDRODIURIL) 25 MG tablet, Take 25 mg by mouth daily., Disp: , Rfl: 0 .  ibuprofen (ADVIL,MOTRIN) 800 MG tablet, Take 800 mg by mouth as needed., Disp: , Rfl: 0 .  levothyroxine (SYNTHROID, LEVOTHROID) 75 MCG tablet, Take 75 mcg by mouth daily., Disp: , Rfl: 0 .  medroxyPROGESTERone  (PROVERA) 5 MG tablet, Take 1 tablet (5 mg total) by mouth daily. One tablet daily x 2 weeks. Repeat as directed, Disp: 42 tablet, Rfl: 1 .  Melatonin 5 MG TABS, Take 1 tablet by mouth at bedtime., Disp: , Rfl:  .  omeprazole (PRILOSEC) 20 MG capsule, Take 20 mg by mouth daily., Disp: , Rfl:  .  pravastatin (PRAVACHOL) 20 MG tablet, Take 20 mg by mouth daily., Disp: , Rfl: 0 .  Ferrous Sulfate (IRON) 325 (65 FE) MG TABS, Take 65 mg by mouth daily., Disp: , Rfl:    Social History: Reviewed -  reports that she has never smoked. She has never used smokeless tobacco.  Objective Findings:  Vitals: Blood pressure 102/68, pulse 80, height 4\' 11"  (1.499 m), weight 262 lb 9.6 oz (119.115 kg), last menstrual period 12/20/2014.  Physical Examination:  Patient here for discussion only.    Assessment & Plan:   A: menorrhagia, with good response to initial provera withdrawal Marked obesity with decrease activity 1. Continue provera x 14 d / month x  2 more months. 2. Encouraged exercise.   P:  1. F/u in 2 months for pelvic U/S.  This chart was SCRIBED for Mallory Shirk, MD by Stephania Fragmin, ED Scribe. This patient was seen in room 3 and the patient's care was started at 3:07 PM.  I personally performed the services described in this documentation, which was SCRIBED in my presence. The recorded information has been reviewed and considered accurate. It has been edited as necessary during review. Jonnie Kind, MD

## 2015-07-21 ENCOUNTER — Ambulatory Visit: Payer: BLUE CROSS/BLUE SHIELD | Admitting: Obstetrics and Gynecology

## 2015-07-22 ENCOUNTER — Ambulatory Visit (HOSPITAL_COMMUNITY): Payer: BLUE CROSS/BLUE SHIELD | Admitting: Hematology & Oncology

## 2015-07-22 ENCOUNTER — Other Ambulatory Visit (HOSPITAL_COMMUNITY): Payer: BLUE CROSS/BLUE SHIELD

## 2015-08-02 ENCOUNTER — Telehealth: Payer: Self-pay | Admitting: Obstetrics and Gynecology

## 2015-08-02 ENCOUNTER — Ambulatory Visit (INDEPENDENT_AMBULATORY_CARE_PROVIDER_SITE_OTHER): Payer: BLUE CROSS/BLUE SHIELD | Admitting: Obstetrics and Gynecology

## 2015-08-02 DIAGNOSIS — Z538 Procedure and treatment not carried out for other reasons: Secondary | ICD-10-CM

## 2015-08-02 MED ORDER — MEDROXYPROGESTERONE ACETATE 5 MG PO TABS: 5.0000 mg | ORAL_TABLET | Freq: Every day | ORAL | Status: DC

## 2015-08-02 NOTE — Telephone Encounter (Signed)
Pt appt to be rescheduled due to my surgery running long. Pt discussed how she's doing by phone, and she's having a normal 5 day period in response to provera. NO imb, pcb. Assess:  Perimenopause  Plan Pt to take provera 5 x 14 days every three months to control endometrium.  Pt to follow up 6 months by appt. For ROS

## 2015-08-11 ENCOUNTER — Encounter (HOSPITAL_COMMUNITY): Payer: BLUE CROSS/BLUE SHIELD

## 2015-08-11 ENCOUNTER — Encounter (HOSPITAL_COMMUNITY): Payer: BLUE CROSS/BLUE SHIELD | Attending: Hematology & Oncology | Admitting: Hematology & Oncology

## 2015-08-11 ENCOUNTER — Encounter (HOSPITAL_COMMUNITY): Payer: Self-pay | Admitting: Hematology & Oncology

## 2015-08-11 VITALS — BP 111/86 | HR 83 | Temp 97.9°F | Resp 16 | Wt 261.9 lb

## 2015-08-11 DIAGNOSIS — Z0000 Encounter for general adult medical examination without abnormal findings: Secondary | ICD-10-CM | POA: Diagnosis not present

## 2015-08-11 DIAGNOSIS — Z23 Encounter for immunization: Secondary | ICD-10-CM | POA: Diagnosis not present

## 2015-08-11 DIAGNOSIS — N924 Excessive bleeding in the premenopausal period: Secondary | ICD-10-CM | POA: Insufficient documentation

## 2015-08-11 DIAGNOSIS — N920 Excessive and frequent menstruation with regular cycle: Secondary | ICD-10-CM | POA: Diagnosis not present

## 2015-08-11 DIAGNOSIS — D751 Secondary polycythemia: Secondary | ICD-10-CM

## 2015-08-11 LAB — CBC WITH DIFFERENTIAL/PLATELET
Basophils Absolute: 0 10*3/uL (ref 0.0–0.1)
Basophils Relative: 0 %
EOS ABS: 0.3 10*3/uL (ref 0.0–0.7)
Eosinophils Relative: 4 %
HEMATOCRIT: 41.8 % (ref 36.0–46.0)
HEMOGLOBIN: 13 g/dL (ref 12.0–15.0)
LYMPHS ABS: 2.3 10*3/uL (ref 0.7–4.0)
Lymphocytes Relative: 34 %
MCH: 24.3 pg — AB (ref 26.0–34.0)
MCHC: 31.1 g/dL (ref 30.0–36.0)
MCV: 78.3 fL (ref 78.0–100.0)
MONO ABS: 0.3 10*3/uL (ref 0.1–1.0)
MONOS PCT: 5 %
NEUTROS PCT: 57 %
Neutro Abs: 3.8 10*3/uL (ref 1.7–7.7)
Platelets: 350 10*3/uL (ref 150–400)
RBC: 5.34 MIL/uL — ABNORMAL HIGH (ref 3.87–5.11)
RDW: 13.7 % (ref 11.5–15.5)
WBC: 6.8 10*3/uL (ref 4.0–10.5)

## 2015-08-11 LAB — FERRITIN: FERRITIN: 10 ng/mL — AB (ref 11–307)

## 2015-08-11 MED ORDER — INFLUENZA VAC SPLIT QUAD 0.5 ML IM SUSY
0.5000 mL | PREFILLED_SYRINGE | Freq: Once | INTRAMUSCULAR | Status: AC
  Administered 2015-08-11: 0.5 mL via INTRAMUSCULAR
  Filled 2015-08-11: qty 0.5

## 2015-08-11 NOTE — Patient Instructions (Signed)
[place] at [facility] Discharge Instructions  RECOMMENDATIONS MADE BY THE CONSULTANT AND ANY TEST RESULTS WILL BE SENT TO YOUR REFERRING PHYSICIAN.  Exam and discussion by Dr. Whitney Muse If any issues with labs we will call you. Flu vaccine today Call with any concerns. Follow-up in 6 months with labs and office visit.  Thank you for choosing [place] at [facility] to provide your oncology and hematology care.  To afford each patient quality time with our provider, please arrive at least 15 minutes before your scheduled appointment time.    You need to re-schedule your appointment should you arrive 10 or more minutes late.  We strive to give you quality time with our providers, and arriving late affects you and other patients whose appointments are after yours.  Also, if you no show three or more times for appointments you may be dismissed from the clinic at the providers discretion.     Again, thank you for choosing [facility]  Our hope is that these requests will decrease the amount of time that you wait before being seen by our physicians.       _____________________________________________________________  Should you have questions after your visit to [facility], please contact our office at [phone] between the hours of 8:30 a.m. and 4:30 p.m.  Voicemails left after 4:30 p.m. will not be returned until the following business day.  For prescription refill requests, have your pharmacy contact our office.

## 2015-08-11 NOTE — Progress Notes (Signed)
Christine Foley presented for labwork. Labs per MD order drawn via Peripheral Line 23 gauge needle inserted in right forearm  Good blood return present. Procedure without incident.  Needle removed intact. Patient tolerated procedure well.  Christine Foley presents today for injection per MD orders. Flu vaccine administered IM in left deltoid. Administration without incident. Patient tolerated well.

## 2015-08-11 NOTE — Progress Notes (Signed)
[facility] Hematology/Oncology Progress Note  Name: Christine Foley      MRN: [id number]   Date: 08/11/2015 Time:2:48 PM   REFERRING PHYSICIAN:  Karna Dupes, PA-C; [place] CONSULT:  Polycythemia   DIAGNOSIS:   Polycythemia History of H. 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  She is here today to review studies obtained since her last visit including laboratory tests. She presents alone today and is feeling fine.  The patient has seen Dr. Glo Herring.  She is on provera monthly. She states that she is feeling the same.  She has an appointment to discuss her sleep apnea next Thursday with Dr. Merlene Laughter.   PAST MEDICAL HISTORY:   Past Medical History  Diagnosis Date   Arthritis     ALLERGIES: Allergies  Allergen Reactions   Penicillins Shortness Of Breath      MEDICATIONS: I have reviewed the patient's current medications.    Current Outpatient Prescriptions on File Prior to Visit  Medication Sig Dispense Refill   ALBUTEROL IN Inhale into the lungs as needed.     benazepril (LOTENSIN) 20 MG tablet Take 20 mg by mouth daily.  1   calcium carbonate (OS-CAL) 600 MG TABS tablet Take 600 mg by mouth 2 (two) times daily with a meal.     cetirizine (ZYRTEC) 10 MG tablet Take 10 mg by mouth at bedtime as needed for allergies.     Cholecalciferol (VITAMIN D3) 5000 UNITS CAPS Take 1,000 Units by mouth daily.      fluticasone (FLONASE) 50 MCG/ACT nasal spray Place 1 spray into both nostrils daily.  0   hydrochlorothiazide (HYDRODIURIL) 25 MG tablet Take  25 mg by mouth daily.  0   ibuprofen (ADVIL,MOTRIN) 800 MG tablet Take 800 mg by mouth as needed.  0   levothyroxine (SYNTHROID, LEVOTHROID) 75 MCG tablet Take 75 mcg by mouth daily.  0   omeprazole (PRILOSEC) 20 MG capsule Take 20 mg by mouth daily.     pravastatin (PRAVACHOL) 20 MG tablet Take 20 mg by mouth daily.  0   Ferrous Sulfate (IRON) 325 (65 FE) MG TABS Take 65 mg by mouth daily.     medroxyPROGESTERone (PROVERA) 5 MG tablet Take 1 tablet (5 mg total) by mouth daily. One tablet daily x 2 weeks. Repeat every 3 months and record menstrual period (Patient not taking: Reported on 08/11/2015) 42 tablet 1   Melatonin 5 MG TABS Take 1 tablet by mouth at bedtime.     No current facility-administered medications on file prior to visit.     PAST SURGICAL HISTORY Past Surgical History  Procedure Laterality Date   Cesarean section      x 2    FAMILY HISTORY: Family History  Problem Relation Age of Onset   Diabetes Mother    Hypertension Father     SOCIAL HISTORY:  reports that she has never smoked. She has never used smokeless tobacco. She reports that she does not drink alcohol or use illicit drugs.  14 point review of systems was performed and is negative except as detailed under  history of present illness and above   PERFORMANCE STATUS: The patient's performance status is 1 - Symptomatic but completely ambulatory  PHYSICAL EXAM: Most Recent Vital Signs:  Filed Vitals:   08/11/15 1429  BP: 111/86  Pulse: 83  Temp: 97.9 F (36.6 C)  Resp: 16   General appearance: alert, cooperative, appears stated age, no distress and morbidly obese Head: Normocephalic, without obvious abnormality, atraumatic Eyes: conjunctivae/corneas clear. PERRL, EOM's intact. Fundi benign. Throat: lips, mucosa, and tongue normal; teeth and gums normal Neck: no adenopathy, supple, symmetrical, trachea midline and thyroid not enlarged, symmetric, no tenderness/mass/nodules Lungs: clear to  auscultation bilaterally and normal percussion bilaterally Heart: regular rate and rhythm, S1, S2 normal, no murmur, click, rub or gallop Abdomen: normal findings: bowel sounds normal, no masses palpable, no organomegaly, soft, non-tender, spleen non-palpable, symmetric and umbilicus normal and abnormal findings:  obese Extremities: extremities normal, atraumatic, no cyanosis or edema Skin: Skin color, texture, turgor normal. No rashes or lesions Lymph nodes: Cervical, supraclavicular, and axillary nodes normal. Neurologic: Alert and oriented X 3, normal strength and tone. Normal symmetric reflexes. Normal coordination and gait  LABORATORY DATA:  I have reviewed the data below as listed.  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 Range 05/05/2015 14:58 08/11/2015 14:34  WBC Latest Ref Range: 4.0-10.5 K/uL  6.8  RBC Latest Ref Range: 3.87-5.11 MIL/uL  5.34 (H)  Hemoglobin Latest Ref Range: 12.0-15.0 g/dL 12.5 13.0  HCT Latest Ref Range: 36.0-46.0 %  41.8  MCV Latest Ref Range: 78.0-100.0 fL  78.3  MCH Latest Ref Range: 26.0-34.0 pg  24.3 (L)  MCHC Latest Ref Range: 30.0-36.0 g/dL  31.1  RDW Latest Ref Range: 11.5-15.5 %  13.7  Platelets Latest Ref Range: 150-400 K/uL  350  Neutrophils Latest Units: %  57  Lymphocytes Latest Units: %  34  Monocytes Relative Latest Units: %  5  Eosinophil Latest Units: %  4  Basophil Latest Units: %  0  NEUT# Latest Ref Range: 1.7-7.7 K/uL  3.8  Lymphocyte # Latest Ref Range: 0.7-4.0 K/uL  2.3  Monocyte # Latest Ref Range: 0.1-1.0 K/uL  0.3  Eosinophils Absolute Latest Ref Range: 0.0-0.7 K/uL  0.3  Basophils Absolute Latest Ref Range: 0.0-0.1 K/uL  0.0    RADIOGRAPHY: GYNECOLOGIC SONOGRAM   Christine Foley is a 54 y.o. LMP 12/20/2014 for a pelvic sonogram for post menopausal bleeding since febuary.  Uterus 9.6 x 5.3 x 5.1 cm, wnl, 1.4 x 1.4 x 1.2 cm simple  nabothian cyst  Endometrium 14 mm, symmetrical, thickened (limited view)  Right ovary Unable to see rt ov,no adnexal masses or cysts seen  Left ovary 2.8 x 2.1 x 1.14 cm, ? Lt ov (limited view)    Technician Comments:   [name] 04/20/2015 11:45 AM  Clinical Impression and recommendations:  I have reviewed the sonogram results above. Clinical history notable for amenorrhea x 11 months in 2015, with irregular bleeding since February. Endometrial biopsy benign proliferative endometrium performed 6.7.16  Combined with the patient's current clinical course, below are my impressions and any appropriate recommendations for management based on the sonographic findings: 1 Symmetric thickened endometrium without identifiable polyp, histologically proliferative endomerium 2. Recommend withdrawal with provera x 14 days/ mo x 3 months and rescan.  3 hysteroscopy if unable to slough endometrium.   FERGUSON,JOHN V   ASSESSMENT:   Polycythemia History of H. Pylori JAK 2 mutation  negative V617F, exon 12 and exon 13 Sleep apnea Abnormal menstrual bleeding Asthma Seasonal allergies Morbid obesity  PLAN:  Laboratory studies were reviewed with the patient. I advised her that I suspect she has a mild elevation in hemoglobin and hematocrit from her sleep apnea. She has follow-up with Dr. Merlene Laughter next Thursday.  She will continue to follow with Dr. Glo Herring. Labs are pending today.  We have checked an iron level given her heavy vaginal bleeding.   Review of recent CBC shows no evidence of atypical lymphocytes as reported out on a lab from May. We will continue to follow.  All questions were answered. The patient knows to call the clinic with any problems, questions or concerns. We can certainly see the patient much sooner if necessary.   This note is electronically signed.  This document serves as a record of services personally performed by  Ancil Linsey, MD. It was created on her behalf by Janace Hoard, a trained medical scribe. The creation of this record is based on the scribe's personal observations and the provider's statements to them. This document has been checked and approved by the attending provider.  I have reviewed the above documentation for accuracy and completeness, and I agree with the above.  Shanon K. Whitney Muse, MD

## 2015-08-13 NOTE — Progress Notes (Signed)
Cancelled appt

## 2015-08-15 ENCOUNTER — Encounter (HOSPITAL_COMMUNITY): Payer: Self-pay | Admitting: Hematology & Oncology

## 2015-10-26 ENCOUNTER — Other Ambulatory Visit (HOSPITAL_COMMUNITY): Payer: Self-pay

## 2015-11-03 ENCOUNTER — Other Ambulatory Visit (HOSPITAL_COMMUNITY): Payer: Self-pay

## 2015-11-08 ENCOUNTER — Encounter (HOSPITAL_COMMUNITY): Payer: Self-pay

## 2015-11-08 ENCOUNTER — Other Ambulatory Visit (HOSPITAL_COMMUNITY): Payer: Self-pay | Admitting: Hematology & Oncology

## 2015-11-08 ENCOUNTER — Encounter (HOSPITAL_COMMUNITY): Payer: BLUE CROSS/BLUE SHIELD | Attending: Hematology & Oncology

## 2015-11-08 DIAGNOSIS — N924 Excessive bleeding in the premenopausal period: Secondary | ICD-10-CM | POA: Insufficient documentation

## 2015-11-08 DIAGNOSIS — E611 Iron deficiency: Secondary | ICD-10-CM

## 2015-11-08 DIAGNOSIS — Z0000 Encounter for general adult medical examination without abnormal findings: Secondary | ICD-10-CM | POA: Insufficient documentation

## 2015-11-08 MED ORDER — SODIUM CHLORIDE 0.9 % IJ SOLN: 10.0000 mL | INTRAMUSCULAR | Status: DC | PRN

## 2015-11-08 MED ORDER — FERUMOXYTOL INJECTION 510 MG/17 ML
510.0000 mg | Freq: Once | INTRAVENOUS | Status: AC
  Administered 2015-11-08: 510 mg via INTRAVENOUS
  Filled 2015-11-08: qty 17

## 2015-11-08 MED ORDER — SODIUM CHLORIDE 0.9 % IV SOLN
INTRAVENOUS | Status: DC
  Administered 2015-11-08: 14:00:00 via INTRAVENOUS

## 2015-11-08 NOTE — Progress Notes (Signed)
Tolerated infusion w/o adverse reaction. VSS.  A&Ox4, in no distress. Discharged ambulatory.

## 2015-11-08 NOTE — Patient Instructions (Addendum)
[place] at [facility] Discharge Instructions  RECOMMENDATIONS MADE BY THE CONSULTANT AND ANY TEST RESULTS WILL BE SENT TO YOUR REFERRING PHYSICIAN.  Today you received IV iron (Feraheme). Return as scheduled for lab work and office visit.   Thank you for choosing [place] at [facility] to provide your oncology and hematology care.  To afford each patient quality time with our provider, please arrive at least 15 minutes before your scheduled appointment time.    You need to re-schedule your appointment should you arrive 10 or more minutes late.  We strive to give you quality time with our providers, and arriving late affects you and other patients whose appointments are after yours.  Also, if you no show three or more times for appointments you may be dismissed from the clinic at the providers discretion.     Again, thank you for choosing [facility].  Our hope is that these requests will decrease the amount of time that you wait before being seen by our physicians.       _____________________________________________________________  Should you have questions after your visit to [facility], please contact our office at [phone] between the hours of 8:30 a.m. and 4:30 p.m.  Voicemails left after 4:30 p.m. will not be returned until the following business day.  For prescription refill requests, have your pharmacy contact our office.   Ferumoxytol injection What is this medicine? FERUMOXYTOL is an iron complex. Iron is used to make healthy red blood cells, which carry oxygen and nutrients throughout the body. This medicine is used to treat iron deficiency anemia in people with chronic kidney disease. This medicine may be used for other purposes; ask your health care provider or pharmacist if you have questions. What should I tell my health care provider before I take this medicine? They need to know if you have any  of these conditions: -anemia not caused by low iron levels -high levels of iron in the blood -magnetic resonance imaging (MRI) test scheduled -an unusual or allergic reaction to iron, other medicines, foods, dyes, or preservatives -pregnant or trying to get pregnant -breast-feeding How should I use this medicine? This medicine is for injection into a vein. It is given by a health care professional in a hospital or clinic setting. Talk to your pediatrician regarding the use of this medicine in children. Special care may be needed. Overdosage: If you think you have taken too much of this medicine contact a poison control center or emergency room at once. NOTE: This medicine is only for you. Do not share this medicine with others. What if I miss a dose? It is important not to miss your dose. Call your doctor or health care professional if you are unable to keep an appointment. What may interact with this medicine? This medicine may interact with the following medications: -other iron products This list may not describe all possible interactions. Give your health care provider a list of all the medicines, herbs, non-prescription drugs, or dietary supplements you use. Also tell them if you smoke, drink alcohol, or use illegal drugs. Some items may interact with your medicine. What should I watch for while using this medicine? Visit your doctor or healthcare professional regularly. Tell your doctor or healthcare professional if your symptoms do not start to get better or if they get worse. You may need blood work done while you are taking this medicine. You may need to  follow a special diet. Talk to your doctor. Foods that contain iron include: whole grains/cereals, dried fruits, beans, or peas, leafy green vegetables, and organ meats (liver, kidney). What side effects may I notice from receiving this medicine? Side effects that you should report to your doctor or health care professional as soon as  possible: -allergic reactions like skin rash, itching or hives, swelling of the face, lips, or tongue -breathing problems -changes in blood pressure -feeling faint or lightheaded, falls -fever or chills -flushing, sweating, or hot feelings -swelling of the ankles or feet Side effects that usually do not require medical attention (Report these to your doctor or health care professional if they continue or are bothersome.): -diarrhea -headache -nausea, vomiting -stomach pain This list may not describe all possible side effects. Call your doctor for medical advice about side effects. You may report side effects to FDA at 1-800-FDA-1088. Where should I keep my medicine? This drug is given in a hospital or clinic and will not be stored at home. NOTE: This sheet is a summary. It may not cover all possible information. If you have questions about this medicine, talk to your doctor, pharmacist, or health care provider.    2016, Elsevier/Gold Standard. (2012-06-06 15:23:36)

## 2016-01-30 ENCOUNTER — Ambulatory Visit: Payer: BLUE CROSS/BLUE SHIELD | Admitting: Obstetrics and Gynecology

## 2016-02-02 ENCOUNTER — Ambulatory Visit (INDEPENDENT_AMBULATORY_CARE_PROVIDER_SITE_OTHER): Payer: BLUE CROSS/BLUE SHIELD | Admitting: Obstetrics and Gynecology

## 2016-02-02 ENCOUNTER — Encounter: Payer: Self-pay | Admitting: Obstetrics and Gynecology

## 2016-02-02 VITALS — BP 124/86 | Ht 59.0 in | Wt 261.0 lb

## 2016-02-02 DIAGNOSIS — N924 Excessive bleeding in the premenopausal period: Secondary | ICD-10-CM

## 2016-02-02 NOTE — Progress Notes (Signed)
Patient ID: CHAMPAYNE LEI, female   DOB: [birth year], 55 y.o.   MRN: [id number]   [place] [facility] Visit  Patient name: ANNIAH WICKLAND MRN [id number]  Date of birth: 1961/06/15  CC & HPI:  SHAWNEEN KUKLINSKI is a 55 y.o. female, with PMHx noted below including menorrhagia, presenting today for maintenance of menorrhagia. Pt reports that the provera continues to work for her. Pt reports the following schedule of provera use and menstrual bleeding: (1)10/09/15-10/22/15 provera; menstrual period 10/13/15-10/23/15; 01/14/16-01/27/16 provera; menstrual period 01/30/16-present.   ROS:  Pt denies any other relevant med probs.  Pertinent History Reviewed:   Reviewed: Significant for cesarean section x2 Medical         Past Medical History  Diagnosis Date  . Arthritis   . Thyroid disease   . Hypertension   . Hyperlipidemia   . Anemia                               Surgical Hx:    Past Surgical History  Procedure Laterality Date  . Cesarean section      x 2  . Iron infusion      . Tubal ligation     Medications: Reviewed & Updated - see associated section                       Current outpatient prescriptions:  .  ALBUTEROL IN, Inhale into the lungs as needed., Disp: , Rfl:  .  benazepril (LOTENSIN) 20 MG tablet, Take 20 mg by mouth daily., Disp: , Rfl: 1 .  calcium carbonate (OS-CAL) 600 MG TABS tablet, Take 600 mg by mouth 2 (two) times daily with a meal., Disp: , Rfl:  .  cetirizine (ZYRTEC) 10 MG tablet, Take 10 mg by mouth at bedtime as needed for allergies., Disp: , Rfl:  .  Cholecalciferol (VITAMIN D3) 5000 UNITS CAPS, Take 1,000 Units by mouth daily. , Disp: , Rfl:  .  fluticasone (FLONASE) 50 MCG/ACT nasal spray, Place 1 spray into both nostrils daily., Disp: , Rfl: 0 .  hydrochlorothiazide (HYDRODIURIL) 25 MG tablet, Take 25 mg by mouth daily., Disp: , Rfl: 0 .  ibuprofen (ADVIL,MOTRIN) 800 MG tablet, Take 800 mg by mouth as needed., Disp: , Rfl: 0 .  levothyroxine  (SYNTHROID, LEVOTHROID) 75 MCG tablet, Take 75 mcg by mouth daily., Disp: , Rfl: 0 .  medroxyPROGESTERone (PROVERA) 5 MG tablet, Take 1 tablet (5 mg total) by mouth daily. One tablet daily x 2 weeks. Repeat every 3 months and record menstrual period, Disp: 42 tablet, Rfl: 1 .  Melatonin 5 MG TABS, Take 1 tablet by mouth at bedtime., Disp: , Rfl:  .  omeprazole (PRILOSEC) 20 MG capsule, Take 20 mg by mouth daily., Disp: , Rfl:  .  pravastatin (PRAVACHOL) 20 MG tablet, Take 20 mg by mouth daily., Disp: , Rfl: 0   Social History: Reviewed -  reports that she has never smoked. She has never used smokeless tobacco.  Objective Findings:  Vitals: Blood pressure 124/86, height 4\' 11"  (1.499 m), weight 261 lb (118.389 kg), last menstrual period 01/30/2016. Body mass index is 52.69 kg/(m^2).  Physical Examination: Discussion only.  Discussed with pt management of menorrhagia with provera. At end of discussion, pt had opportunity to ask questions and has no further questions at this time. Greater than 50% was spent in counseling and coordination of care with  the patient. Total time greater than: 15 minutes Assessment & Plan:   A:  1. menorrhagia, controlled by provera withdrawal  P:  1. Continue provera x 14 q/3 months 2. F/u in 1 year or PRN    By signing my name below, I, Terressa Koyanagi, attest that this documentation has been prepared under the direction and in the presence of Mallory Shirk, MD. Electronically Signed: Terressa Koyanagi, ED Scribe. 02/02/2016. 2:10 PM.   I personally performed the services described in this documentation, which was SCRIBED in my presence. The recorded information has been reviewed and considered accurate. It has been edited as necessary during review. Jonnie Kind, MD

## 2016-02-09 ENCOUNTER — Encounter (HOSPITAL_COMMUNITY): Payer: BLUE CROSS/BLUE SHIELD

## 2016-02-09 ENCOUNTER — Encounter (HOSPITAL_COMMUNITY): Payer: Self-pay | Admitting: Hematology & Oncology

## 2016-02-09 ENCOUNTER — Encounter (HOSPITAL_COMMUNITY): Payer: BLUE CROSS/BLUE SHIELD | Attending: Hematology & Oncology | Admitting: Hematology & Oncology

## 2016-02-09 VITALS — BP 124/74 | HR 73 | Temp 98.0°F | Resp 18 | Wt 261.0 lb

## 2016-02-09 DIAGNOSIS — G4730 Sleep apnea, unspecified: Secondary | ICD-10-CM | POA: Diagnosis not present

## 2016-02-09 DIAGNOSIS — D751 Secondary polycythemia: Secondary | ICD-10-CM | POA: Diagnosis not present

## 2016-02-09 DIAGNOSIS — N924 Excessive bleeding in the premenopausal period: Secondary | ICD-10-CM

## 2016-02-09 DIAGNOSIS — Z0000 Encounter for general adult medical examination without abnormal findings: Secondary | ICD-10-CM | POA: Insufficient documentation

## 2016-02-09 DIAGNOSIS — E876 Hypokalemia: Secondary | ICD-10-CM | POA: Diagnosis not present

## 2016-02-09 DIAGNOSIS — E039 Hypothyroidism, unspecified: Secondary | ICD-10-CM | POA: Diagnosis not present

## 2016-02-09 DIAGNOSIS — I10 Essential (primary) hypertension: Secondary | ICD-10-CM

## 2016-02-09 LAB — COMPREHENSIVE METABOLIC PANEL
ALK PHOS: 57 U/L (ref 38–126)
ALT: 18 U/L (ref 14–54)
ANION GAP: 8 (ref 5–15)
AST: 19 U/L (ref 15–41)
Albumin: 3.9 g/dL (ref 3.5–5.0)
BUN: 14 mg/dL (ref 6–20)
CHLORIDE: 103 mmol/L (ref 101–111)
CO2: 29 mmol/L (ref 22–32)
Calcium: 9 mg/dL (ref 8.9–10.3)
Creatinine, Ser: 0.76 mg/dL (ref 0.44–1.00)
GLUCOSE: 88 mg/dL (ref 65–99)
POTASSIUM: 4 mmol/L (ref 3.5–5.1)
Sodium: 140 mmol/L (ref 135–145)
TOTAL PROTEIN: 6.9 g/dL (ref 6.5–8.1)
Total Bilirubin: 1 mg/dL (ref 0.3–1.2)

## 2016-02-09 LAB — CBC WITH DIFFERENTIAL/PLATELET
Basophils Absolute: 0 10*3/uL (ref 0.0–0.1)
Basophils Relative: 1 %
Eosinophils Absolute: 0.4 10*3/uL (ref 0.0–0.7)
Eosinophils Relative: 5 %
HCT: 43.7 % (ref 36.0–46.0)
Hemoglobin: 14.3 g/dL (ref 12.0–15.0)
Lymphocytes Relative: 39 %
Lymphs Abs: 2.7 10*3/uL (ref 0.7–4.0)
MCH: 26.5 pg (ref 26.0–34.0)
MCHC: 32.7 g/dL (ref 30.0–36.0)
MCV: 81.1 fL (ref 78.0–100.0)
Monocytes Absolute: 0.3 10*3/uL (ref 0.1–1.0)
Monocytes Relative: 4 %
Neutro Abs: 3.6 10*3/uL (ref 1.7–7.7)
Neutrophils Relative %: 51 %
Platelets: 315 10*3/uL (ref 150–400)
RBC: 5.39 MIL/uL — ABNORMAL HIGH (ref 3.87–5.11)
RDW: 14.5 % (ref 11.5–15.5)
WBC: 6.9 10*3/uL (ref 4.0–10.5)

## 2016-02-09 NOTE — Patient Instructions (Addendum)
[place] at [facility] Discharge Instructions  RECOMMENDATIONS MADE BY THE CONSULTANT AND ANY TEST RESULTS WILL BE SENT TO YOUR REFERRING PHYSICIAN.   Exam and discussion by Dr Whitney Muse today Labs discussed and everything looks good. Return to see the doctor in 6 months, labs in 6 months Please call the clinic if you have any questions or concerns     Thank you for choosing [place] at [facility] to provide your oncology and hematology care.  To afford each patient quality time with our provider, please arrive at least 15 minutes before your scheduled appointment time.   Beginning January 23rd 2017 lab work for the [facility] will be done in the  Main lab at [facility] on 1st floor. If you have a lab appointment with the [facility] please come in thru the  Main Entrance and check in at the main information desk  You need to re-schedule your appointment should you arrive 10 or more minutes late.  We strive to give you quality time with our providers, and arriving late affects you and other patients whose appointments are after yours.  Also, if you no show three or more times for appointments you may be dismissed from the clinic at the providers discretion.     Again, thank you for choosing [facility].  Our hope is that these requests will decrease the amount of time that you wait before being seen by our physicians.       _____________________________________________________________  Should you have questions after your visit to [facility], please contact our office at [phone] between the hours of 8:30 a.m. and 4:30 p.m.  Voicemails left after 4:30 p.m. will not be returned until the following business day.  For prescription refill requests, have your pharmacy contact our office.         Resources For Cancer Patients and their Caregivers ? American Cancer Society: Can assist with  transportation, wigs, general needs, runs Look Good Feel Better.        507-410-6483 ? Cancer Care: Provides financial assistance, online support groups, medication/co-pay assistance.  1-800-813-HOPE 470-594-3168) ? [facility] Assists [place] cancer patients and their families through emotional , educational and financial support.  938-254-7703 ? [place] DSS Where to apply for food stamps, Medicaid and utility assistance. 940-453-1954 ? RCATS: Transportation to medical appointments. 367-397-8075 ? Social Security Administration: May apply for disability if have a Stage IV cancer. 619 324 5077 671-722-8001 ? [employer], Disability and Transit Services: Assists with nutrition, care and transit needs. [phone]

## 2016-02-09 NOTE — Progress Notes (Signed)
[facility] Hematology/Oncology Progress Note  Name: Christine Foley      MRN: [id number]    Location: Room/bed info not found  Date: 02/09/2016 Time:2:08 PM   REFERRING PHYSICIAN:  Karna Dupes, PA-C; [place] CONSULT:  Polycythemia   DIAGNOSIS:  Polycythemia  HISTORY OF PRESENT ILLNESS:   Christine Foley is a 55 year old white American female with a past medical history significant for asthma, seasonal allergies, HTN, Hyperlipidemia, Hypothyroidism, history of iron deficiency anemia, and left knee arthritis who was referred to the [facility] for an elevated hemoglobin.  Christine Foley was here alone today.  Her blood counts looked good today.  Dr. Glo Herring has gotten her "menstrual difficulties" under control. She notes that she is feeling much better.   She has no other problems or concerns at this time.  She denies headaches, blurry vision, nausea, abdominal pain. No SOB or CP.   PAST MEDICAL HISTORY:   Past Medical History  Diagnosis Date  . Arthritis   . Thyroid disease   . Hypertension   . Hyperlipidemia   . Anemia     ALLERGIES: Allergies  Allergen Reactions  . Penicillins Shortness Of Breath      MEDICATIONS: I have reviewed the patient's current medications.    Current Outpatient Prescriptions on File Prior to Visit  Medication Sig Dispense Refill  . ALBUTEROL IN Inhale into the lungs as needed.    . benazepril (LOTENSIN) 20 MG tablet Take 20 mg by mouth daily.  1  . calcium carbonate (OS-CAL) 600 MG TABS tablet Take 600 mg by mouth 2 (two) times daily with a meal.    . cetirizine (ZYRTEC) 10 MG tablet Take 10 mg by mouth at bedtime as needed for allergies.    . Cholecalciferol (VITAMIN D3) 5000 UNITS CAPS Take 1,000 Units by mouth daily.     . fluticasone (FLONASE) 50 MCG/ACT nasal spray Place 1 spray into both nostrils daily.  0  . hydrochlorothiazide (HYDRODIURIL) 25 MG tablet Take 25 mg by mouth  daily.  0  . ibuprofen (ADVIL,MOTRIN) 800 MG tablet Take 800 mg by mouth as needed.  0  . levothyroxine (SYNTHROID, LEVOTHROID) 75 MCG tablet Take 75 mcg by mouth daily.  0  . Melatonin 5 MG TABS Take 1 tablet by mouth at bedtime.    Marland Kitchen omeprazole (PRILOSEC) 20 MG capsule Take 20 mg by mouth daily.    . pravastatin (PRAVACHOL) 20 MG tablet Take 20 mg by mouth daily.  0  . medroxyPROGESTERone (PROVERA) 5 MG tablet Take 1 tablet (5 mg total) by mouth daily. One tablet daily x 2 weeks. Repeat every 3 months and record menstrual period (Patient not taking: Reported on 02/09/2016) 42 tablet 1   No current facility-administered medications on file prior to visit.     PAST SURGICAL HISTORY Past Surgical History  Procedure Laterality Date  . Cesarean section      x 2  . Iron infusion      . Tubal ligation      FAMILY HISTORY: Family History  Problem Relation Age of Onset  . Diabetes Mother   . Hypertension Father     SOCIAL HISTORY:  reports that she has never smoked. She has never used smokeless tobacco. She reports that she does not drink alcohol or use illicit drugs.  14 point review of systems was performed and is negative except as detailed under history of present  illness and above   PERFORMANCE STATUS: The patient's performance status is 1 - Symptomatic but completely ambulatory   PHYSICAL EXAM: Most Recent Vital Signs:  Filed Vitals:   02/09/16 1300  BP: 124/74  Pulse: 73  Temp: 98 F (36.7 C)  Resp: 18   General appearance:alert, cooperative, appears stated age, no distress and morbidly obese Wears glasses Head: Normocephalic, without obvious abnormality, atraumatic Eyes: conjunctivae/corneas clear. PERRL, EOM's intact. Fundi benign. Throat: lips, mucosa, and tongue normal; teeth and gums normal Neck: no adenopathy, supple, symmetrical, trachea midline and thyroid not enlarged, symmetric, no tenderness/mass/nodules Lungs: clear to auscultation bilaterally and normal  percussion bilaterally Heart: regular rate and rhythm, S1, S2 normal, no murmur, click, rub or gallop Abdomen: normal findings: bowel sounds normal, no masses palpable, no organomegaly, soft, non-tender, spleen non-palpable, symmetric and umbilicus normal and abnormal findings:  obese Extremities: extremities normal, atraumatic, no cyanosis or edema Skin: Skin color, texture, turgor normal. No rashes or lesions Lymph nodes: Cervical, supraclavicular, and axillary nodes normal. Neurologic: Alert and oriented X 3, normal strength and tone. Normal symmetric reflexes. Normal coordination and gait   LABORATORY DATA:  Results for BLUMA, TOBE (MRN [id number]) as of 02/09/2016 13:48  Ref. Range 02/09/2016 13:15  WBC Latest Ref Range: 4.0-10.5 K/uL 6.9  RBC Latest Ref Range: 3.87-5.11 MIL/uL 5.39 (H)  Hemoglobin Latest Ref Range: 12.0-15.0 g/dL 14.3  HCT Latest Ref Range: 36.0-46.0 % 43.7  MCV Latest Ref Range: 78.0-100.0 fL 81.1  MCH Latest Ref Range: 26.0-34.0 pg 26.5  MCHC Latest Ref Range: 30.0-36.0 g/dL 32.7  RDW Latest Ref Range: 11.5-15.5 % 14.5  Platelets Latest Ref Range: 150-400 K/uL 315  Neutrophils Latest Units: % 51  Lymphocytes Latest Units: % 39  Monocytes Relative Latest Units: % 4  Eosinophil Latest Units: % 5  Basophil Latest Units: % 1  NEUT# Latest Ref Range: 1.7-7.7 K/uL 3.6  Lymphocyte # Latest Ref Range: 0.7-4.0 K/uL 2.7  Monocyte # Latest Ref Range: 0.1-1.0 K/uL 0.3  Eosinophils Absolute Latest Ref Range: 0.0-0.7 K/uL 0.4  Basophils Absolute Latest Ref Range: 0.0-0.1 K/uL 0.0   JAK 2 mutation negative V617F, 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 Erythrocytosis. 2. Vaginal bleeding, x 3 months 3. 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 HTN 5. Hyperlipidemia 6. Asthma 7. Seasonal allergies 8. Morbid obesity 9. Hypothyroidism 10. Hypokalemia   PLAN:  Erythrocytosis felt to be secondary to sleep apnea. Improved.  She had menorrhagia, This  is now resolved.She developed severe iron deficiency. She did receive one dose of IV iron with improvement in energy.   Her blood work looked good today. She has no other problems or concerns at this time.  She will return in 6 months for blood work and a follow up.   All questions were answered. The patient knows to call the clinic with any problems, questions or concerns. We can certainly see the patient much sooner if necessary.   This note is electronically signed.  This document serves as a record of services personally performed by Christine Linsey, MD. It was created on her behalf by Kandace Blitz, a trained medical scribe. The creation of this record is based on the scribe's personal observations and the provider's statements to them. This document has been checked and approved by the attending provider.  I have reviewed the above documentation for accuracy and completeness, and I agree with the above.  Shanon K. Whitney Muse, MD

## 2016-03-11 ENCOUNTER — Encounter (HOSPITAL_COMMUNITY): Payer: Self-pay | Admitting: Hematology & Oncology

## 2016-05-31 ENCOUNTER — Ambulatory Visit: Payer: BLUE CROSS/BLUE SHIELD | Admitting: Nutrition

## 2016-08-04 ENCOUNTER — Other Ambulatory Visit: Payer: Self-pay | Admitting: Obstetrics and Gynecology

## 2016-08-15 ENCOUNTER — Ambulatory Visit (HOSPITAL_COMMUNITY): Payer: BLUE CROSS/BLUE SHIELD | Admitting: Hematology & Oncology

## 2016-08-15 ENCOUNTER — Other Ambulatory Visit (HOSPITAL_COMMUNITY): Payer: BLUE CROSS/BLUE SHIELD

## 2016-08-21 ENCOUNTER — Other Ambulatory Visit: Payer: Self-pay | Admitting: Internal Medicine

## 2016-08-21 DIAGNOSIS — Z1239 Encounter for other screening for malignant neoplasm of breast: Secondary | ICD-10-CM

## 2016-09-25 ENCOUNTER — Ambulatory Visit
Admission: RE | Admit: 2016-09-25 | Discharge: 2016-09-25 | Disposition: A | Discharge status: Home / Self Care | Payer: BLUE CROSS/BLUE SHIELD | Source: Ambulatory Visit | Attending: Internal Medicine | Admitting: Internal Medicine

## 2016-09-25 DIAGNOSIS — Z1231 Encounter for screening mammogram for malignant neoplasm of breast: Secondary | ICD-10-CM | POA: Diagnosis not present

## 2016-09-25 DIAGNOSIS — Z1239 Encounter for other screening for malignant neoplasm of breast: Secondary | ICD-10-CM

## 2016-10-09 ENCOUNTER — Encounter (HOSPITAL_COMMUNITY): Payer: BLUE CROSS/BLUE SHIELD | Attending: Hematology & Oncology | Admitting: Oncology

## 2016-10-09 ENCOUNTER — Encounter (HOSPITAL_COMMUNITY): Payer: Self-pay | Admitting: Oncology

## 2016-10-09 ENCOUNTER — Encounter (HOSPITAL_COMMUNITY): Payer: BLUE CROSS/BLUE SHIELD

## 2016-10-09 VITALS — BP 118/78 | HR 93 | Temp 98.2°F | Resp 18 | Wt 266.2 lb

## 2016-10-09 DIAGNOSIS — E611 Iron deficiency: Secondary | ICD-10-CM | POA: Diagnosis not present

## 2016-10-09 DIAGNOSIS — G4730 Sleep apnea, unspecified: Secondary | ICD-10-CM | POA: Diagnosis not present

## 2016-10-09 DIAGNOSIS — Z833 Family history of diabetes mellitus: Secondary | ICD-10-CM | POA: Insufficient documentation

## 2016-10-09 DIAGNOSIS — D751 Secondary polycythemia: Secondary | ICD-10-CM | POA: Diagnosis present

## 2016-10-09 DIAGNOSIS — I10 Essential (primary) hypertension: Secondary | ICD-10-CM | POA: Insufficient documentation

## 2016-10-09 DIAGNOSIS — Z803 Family history of malignant neoplasm of breast: Secondary | ICD-10-CM | POA: Insufficient documentation

## 2016-10-09 DIAGNOSIS — Z9889 Other specified postprocedural states: Secondary | ICD-10-CM | POA: Diagnosis not present

## 2016-10-09 DIAGNOSIS — E079 Disorder of thyroid, unspecified: Secondary | ICD-10-CM | POA: Diagnosis not present

## 2016-10-09 DIAGNOSIS — E785 Hyperlipidemia, unspecified: Secondary | ICD-10-CM | POA: Diagnosis not present

## 2016-10-09 DIAGNOSIS — Z8249 Family history of ischemic heart disease and other diseases of the circulatory system: Secondary | ICD-10-CM | POA: Diagnosis not present

## 2016-10-09 HISTORY — DX: Secondary polycythemia: D75.1

## 2016-10-09 LAB — CBC WITH DIFFERENTIAL/PLATELET
BASOS ABS: 0.1 10*3/uL (ref 0.0–0.1)
Basophils Relative: 1 %
EOS ABS: 0.4 10*3/uL (ref 0.0–0.7)
Eosinophils Relative: 5 %
HCT: 41.8 % (ref 36.0–46.0)
HEMOGLOBIN: 13.5 g/dL (ref 12.0–15.0)
LYMPHS ABS: 2.6 10*3/uL (ref 0.7–4.0)
Lymphocytes Relative: 32 %
MCH: 25.7 pg — AB (ref 26.0–34.0)
MCHC: 32.3 g/dL (ref 30.0–36.0)
MCV: 79.5 fL (ref 78.0–100.0)
Monocytes Absolute: 0.5 10*3/uL (ref 0.1–1.0)
Monocytes Relative: 6 %
NEUTROS PCT: 56 %
Neutro Abs: 4.6 10*3/uL (ref 1.7–7.7)
Platelets: 322 10*3/uL (ref 150–400)
RBC: 5.26 MIL/uL — AB (ref 3.87–5.11)
RDW: 14.3 % (ref 11.5–15.5)
WBC: 8.1 10*3/uL (ref 4.0–10.5)

## 2016-10-09 LAB — FERRITIN: FERRITIN: 9 ng/mL — AB (ref 11–307)

## 2016-10-09 NOTE — Assessment & Plan Note (Addendum)
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 AND Iron deficiency, requiring IV iron in Jan 2017.  Likely secondary to menses.  Labs today: CBC diff, ferritin.  I personally reviewed and went over laboratory results with the patient.  The results are noted within this dictation.  If iron deficiency persists, then further investigation into cause would be indicated.  Labs in 6 months: CBC diff, ferritin.  Return in 6 months for follow-up.

## 2016-10-09 NOTE — Patient Instructions (Addendum)
[place] at [facility] Discharge Instructions  RECOMMENDATIONS MADE BY THE CONSULTANT AND ANY TEST RESULTS WILL BE SENT TO YOUR REFERRING PHYSICIAN.  You saw Christine Crigler, PA-C, today. Follow up with Dr. Whitney Foley with labs in 6 months. See Christine Foley at checkout for follow up appointments.  Thank you for choosing [place] at [facility] to provide your oncology and hematology care.  To afford each patient quality time with our provider, please arrive at least 15 minutes before your scheduled appointment time.   Beginning January 23rd 2017 lab work for the [facility] will be done in the  Main lab at [facility] on 1st floor. If you have a lab appointment with the [facility] please come in thru the  Main Entrance and check in at the main information desk  You need to re-schedule your appointment should you arrive 10 or more minutes late.  We strive to give you quality time with our providers, and arriving late affects you and other patients whose appointments are after yours.  Also, if you no show three or more times for appointments you may be dismissed from the clinic at the providers discretion.     Again, thank you for choosing [facility].  Our hope is that these requests will decrease the amount of time that you wait before being seen by our physicians.       _____________________________________________________________  Should you have questions after your visit to [facility], please contact our office at [phone] between the hours of 8:30 a.m. and 4:30 p.m.  Voicemails left after 4:30 p.m. will not be returned until the following business day.  For prescription refill requests, have your pharmacy contact our office.         Resources For Cancer Patients and their Caregivers ? American Cancer Society: Can assist with transportation, wigs, general needs, runs Look Good Feel Better.         662-284-1730 ? Cancer Care: Provides financial assistance, online support groups, medication/co-pay assistance.  1-800-813-HOPE 5343005699) ? [facility] Assists [place] cancer patients and their families through emotional , educational and financial support.  843-640-4628 ? [place] DSS Where to apply for food stamps, Medicaid and utility assistance. 705-574-4267 ? RCATS: Transportation to medical appointments. 361 426 2514 ? Social Security Administration: May apply for disability if have a Stage IV cancer. [phone] 202-023-0946 ? [employer], Disability and Transit Services: Assists with nutrition, care and transit needs. [place] Support Programs: @10RELATIVEDAYS @ > Cancer Support Group  2nd Tuesday of the month 1pm-2pm, Journey Room  > Creative Journey  3rd Tuesday of the month 1130am-1pm, Journey Room  > Look Good Feel Better  1st Wednesday of the month 10am-12 noon, Journey Room (Call [place] to register [phone])

## 2016-10-09 NOTE — Progress Notes (Signed)
Bronson Curb, PA-C [address] [place] [address]  Polycythemia, secondary - Plan: CBC with Differential  Iron deficiency - Plan: CBC with Differential, Ferritin  CURRENT THERAPY: Observation  INTERVAL HISTORY: Christine Foley 55 y.o. female returns for followup of polycythemia, secondary to sleep apnea. AND Iron deficiency, having needed IV iron therapy in Jan 2017.  She is doing well.  She denies any complaints.  She notes that she is still menstruating monthly.  She reports heavy menses.  Review of Systems  Constitutional: Negative.  Negative for chills and fever.  HENT: Negative.   Eyes: Negative.  Negative for blurred vision.  Respiratory: Negative.  Negative for cough.   Cardiovascular: Negative.  Negative for chest pain.  Gastrointestinal: Negative.  Negative for blood in stool and melena.  Genitourinary: Negative.  Negative for hematuria.  Musculoskeletal: Negative.   Skin: Negative.   Neurological: Negative.   Endo/Heme/Allergies: Negative.   Psychiatric/Behavioral: Negative.     Past Medical History:  Diagnosis Date  . Anemia   . Arthritis   . Hyperlipidemia   . Hypertension   . 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 Thyroid disease     Past Surgical History:  Procedure Laterality Date  . CESAREAN SECTION     x 2  . iron infusion      . TUBAL LIGATION      Family History  Problem Relation Age of Onset  . Diabetes Mother   . Hypertension Father   . Breast cancer Maternal Grandmother     Social History   Social History  . Marital status: Married    Spouse name: N/A  . Number of children: N/A  . Years of education: N/A   Social History Main Topics  . Smoking status: Never Smoker  . Smokeless tobacco: Never Used  . Alcohol use No  . Drug use: No  . Sexual activity: Not Currently    Birth control/ protection: Surgical   Other Topics Concern  . Not on file   Social History Narrative  . No narrative on  file     PHYSICAL EXAMINATION  ECOG PERFORMANCE STATUS: 0 - Asymptomatic  Vitals:   10/09/16 1303  BP: 118/78  Pulse: 93  Resp: 18  Temp: 98.2 F (36.8 C)    GENERAL:alert, no distress, well nourished, well developed, comfortable, cooperative, obese, smiling and unaccompanied SKIN: skin color, texture, turgor are normal, no rashes or significant lesions HEAD: Normocephalic, No masses, lesions, tenderness or abnormalities EYES: normal, EOMI, Conjunctiva are pink and non-injected EARS: External ears normal OROPHARYNX:lips, buccal mucosa, and tongue normal and mucous membranes are moist  NECK: supple, trachea midline LYMPH:  not examined BREAST:not examined LUNGS: clear to auscultation  HEART: regular rate & rhythm ABDOMEN:abdomen soft and normal bowel sounds BACK: Back symmetric, no curvature. EXTREMITIES:less then 2 second capillary refill, no joint deformities, effusion, or inflammation, no skin discoloration, no cyanosis  NEURO: alert & oriented x 3 with fluent speech, no focal motor/sensory deficits   LABORATORY DATA: CBC    Component Value Date/Time   WBC 8.1 10/09/2016 1228   RBC 5.26 (H) 10/09/2016 1228   HGB 13.5 10/09/2016 1228   HCT 41.8 10/09/2016 1228   PLT 322 10/09/2016 1228   MCV 79.5 10/09/2016 1228   MCH 25.7 (L) 10/09/2016 1228   MCHC 32.3 10/09/2016 1228   RDW 14.3 10/09/2016 1228   LYMPHSABS 2.6 10/09/2016 1228   MONOABS 0.5 10/09/2016 1228  EOSABS 0.4 10/09/2016 1228   BASOSABS 0.1 10/09/2016 1228      Chemistry      Component Value Date/Time   NA 140 02/09/2016 1315   K 4.0 02/09/2016 1315   CL 103 02/09/2016 1315   CO2 29 02/09/2016 1315   BUN 14 02/09/2016 1315   CREATININE 0.76 02/09/2016 1315      Component Value Date/Time   CALCIUM 9.0 02/09/2016 1315   ALKPHOS 57 02/09/2016 1315   AST 19 02/09/2016 1315   ALT 18 02/09/2016 1315   BILITOT 1.0 02/09/2016 1315     Lab Results  Component Value Date   IRON 48 03/21/2015    TIBC 337 03/21/2015   FERRITIN 10 (L) 08/11/2015     PENDING LABS:   RADIOGRAPHIC STUDIES:  Mm Screening Breast Tomo Bilateral  Result Date: 09/26/2016 CLINICAL DATA:  Screening. EXAM: 2D DIGITAL SCREENING BILATERAL MAMMOGRAM WITH CAD AND ADJUNCT TOMO COMPARISON:  Previous exam(s). ACR Breast Density Category b: There are scattered areas of fibroglandular density. FINDINGS: There are no findings suspicious for malignancy. Images were processed with CAD. IMPRESSION: No mammographic evidence of malignancy. A result letter of this screening mammogram will be mailed directly to the patient. RECOMMENDATION: Screening mammogram in one year. (Code:SM-B-01Y) BI-RADS CATEGORY  1: Negative. Electronically Signed   By: Pamelia Hoit M.D.   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 AND Iron deficiency, requiring IV iron in Jan 2017.  Likely secondary to menses.  Labs today: CBC diff, ferritin.  I personally reviewed and went over laboratory results with the patient.  The results are noted within this dictation.  If iron deficiency persists, then further investigation into cause would be indicated.  Labs in 6 months: CBC diff, ferritin.  Return in 6 months for follow-up.   ORDERS PLACED FOR THIS ENCOUNTER: Orders Placed This Encounter  Procedures  . CBC with Differential  . Ferritin    MEDICATIONS PRESCRIBED THIS ENCOUNTER: No orders of the defined types were placed in this encounter.   THERAPY PLAN:  Ongoing observation  All questions were answered. The patient knows to call the clinic with any problems, questions or concerns. We can certainly see the patient much sooner if necessary.  Patient and plan discussed with Dr. Ancil Linsey and she is in agreement with the aforementioned.   This note is electronically signed by: Doy Mince 10/09/2016 1:31 PM

## 2016-11-29 ENCOUNTER — Other Ambulatory Visit (HOSPITAL_COMMUNITY): Payer: Self-pay | Admitting: Hematology & Oncology

## 2016-11-30 ENCOUNTER — Encounter: Payer: Self-pay | Admitting: Gastroenterology

## 2016-12-06 ENCOUNTER — Encounter (HOSPITAL_COMMUNITY): Payer: BLUE CROSS/BLUE SHIELD | Attending: Oncology

## 2016-12-06 ENCOUNTER — Encounter (HOSPITAL_COMMUNITY): Payer: Self-pay

## 2016-12-06 VITALS — BP 108/56 | HR 89 | Temp 97.7°F | Resp 18

## 2016-12-06 DIAGNOSIS — E611 Iron deficiency: Secondary | ICD-10-CM

## 2016-12-06 DIAGNOSIS — N924 Excessive bleeding in the premenopausal period: Secondary | ICD-10-CM

## 2016-12-06 MED ORDER — SODIUM CHLORIDE 0.9 % IV SOLN
750.0000 mg | Freq: Once | INTRAVENOUS | Status: AC
  Administered 2016-12-06: 750 mg via INTRAVENOUS
  Filled 2016-12-06: qty 15

## 2016-12-06 MED ORDER — SODIUM CHLORIDE 0.9 % IV SOLN
INTRAVENOUS | Status: DC
  Administered 2016-12-06: 15:00:00 via INTRAVENOUS

## 2016-12-06 NOTE — Patient Instructions (Signed)
[facility] at [facility] Discharge Instructions  RECOMMENDATIONS MADE BY THE CONSULTANT AND ANY TEST RESULTS WILL BE SENT TO YOUR REFERRING PHYSICIAN.  Iron infusion today. Return as scheduled.   Thank you for choosing  Cancer Center at [facility] to provide your oncology and hematology care.  To afford each patient quality time with our provider, please arrive at least 15 minutes before your scheduled appointment time.    If you have a lab appointment with the Cancer Center please come in thru the  Main Entrance and check in at the main information desk  You need to re-schedule your appointment should you arrive 10 or more minutes late.  We strive to give you quality time with our providers, and arriving late affects you and other patients whose appointments are after yours.  Also, if you no show three or more times for appointments you may be dismissed from the clinic at the providers discretion.     Again, thank you for choosing [facility].  Our hope is that these requests will decrease the amount of time that you wait before being seen by our physicians.       _____________________________________________________________  Should you have questions after your visit to [facility], please contact our office at [phone] between the hours of 8:30 a.m. and 4:30 p.m.  Voicemails left after 4:30 p.m. will not be returned until the following business day.  For prescription refill requests, have your pharmacy contact our office.       Resources For Cancer Patients and their Caregivers ? American Cancer Society: Can assist with transportation, wigs, general needs, runs Look Good Feel Better.        1-888-227-6333 ? Cancer Care: Provides financial assistance, online support groups, medication/co-pay assistance.  1-800-813-HOPE (4673) ? [facility] Assists [place] cancer patients and their  families through emotional , educational and financial support.  336-427-4357 ? [place] DSS Where to apply for food stamps, Medicaid and utility assistance. 336-342-1394 ? [facility]: Transportation to medical appointments. 336-347-2287 ? Social Security Administration: May apply for disability if have a Stage IV cancer. 336-342-7796 1-800-772-1213 ? [place] Aging, Disability and Transit Services: Assists with nutrition, care and transit needs. 336-349-2343  Cancer Center Support Programs: @10RELATIVEDAYS@ > Cancer Support Group  2nd Tuesday of the month 1pm-2pm, Journey Room  > Creative Journey  3rd Tuesday of the month 1130am-1pm, Journey Room  > Look Good Feel Better  1st Wednesday of the month 10am-12 noon, Journey Room (Call American Cancer Society to register 1-800-395-5775)   

## 2016-12-06 NOTE — Progress Notes (Signed)
Tolerated infusion w/o adverse reaction.  Alert, in no distress.  VSS.  Discharged ambulatory.  

## 2016-12-24 ENCOUNTER — Encounter: Payer: Self-pay | Admitting: Gastroenterology

## 2016-12-24 ENCOUNTER — Other Ambulatory Visit: Payer: Self-pay

## 2016-12-24 ENCOUNTER — Ambulatory Visit (INDEPENDENT_AMBULATORY_CARE_PROVIDER_SITE_OTHER): Payer: BLUE CROSS/BLUE SHIELD | Admitting: Gastroenterology

## 2016-12-24 ENCOUNTER — Telehealth: Payer: Self-pay

## 2016-12-24 VITALS — BP 154/83 | HR 89 | Temp 98.1°F | Ht 59.0 in | Wt 264.0 lb

## 2016-12-24 DIAGNOSIS — D509 Iron deficiency anemia, unspecified: Secondary | ICD-10-CM

## 2016-12-24 DIAGNOSIS — K219 Gastro-esophageal reflux disease without esophagitis: Secondary | ICD-10-CM

## 2016-12-24 DIAGNOSIS — E611 Iron deficiency: Secondary | ICD-10-CM

## 2016-12-24 MED ORDER — PEG 3350-KCL-NA BICARB-NACL 420 G PO SOLR: 4000.0000 mL | ORAL | 0 refills | Status: DC

## 2016-12-24 NOTE — Patient Instructions (Signed)
1. Colonoscopy with possible upper endoscopy as scheduled. See separate instructions.  

## 2016-12-24 NOTE — Progress Notes (Signed)
Primary Care Physician:  Abran Richard, MD  Primary Gastroenterologist:  Barney Drain, MD   Chief Complaint  Patient presents with  . Anemia    HPI:  Christine Foley is a 56 y.o. female here at the request of Dr. Whitney Muse for further evaluation of iron deficiency. She received iron infusion in January 2017 and on 12/06/2016. Patient was seen back in 2008 for iron deficiency anemia and underwent and ileocolonoscopy/EGD with Dr. Oneida Alar. 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 I do not have access to those records to determine what type treatment she underwent for H pylori. She states that she was on oral iron for years and did well however a couple of years ago she started having issues with heavy menstrual bleeding, she thought it was related to having too much blood/iron and she stopped her oral supplement. She required her first iron infusion in January 2017 and again this month. She takes Provera every 3 months. She states over the last 9 months she has had no heavy bleeding, tends to have spotting only.  Bowel movements are regular. Denies melena or rectal bleeding. Denies abdominal pain. She takes Zantac once daily as needed for heartburn. Seems to be related to dietary indiscretions. Really no dysphagia, does complain that things go down the wrong way sometimes.  Patient states she has a history of sleep apnea but was not able to tolerate CPAP. States she can ever fall asleep on it. Also felt like she was having difficulty releasing her from her lungs. Sometimes notes that when she is laying down on her back. Complains of anxiety related to this.  Denies NSAID or aspirin use. States she used to take ibuprofen on occasion but hasn't taken it in several months.   Current Outpatient Prescriptions  Medication Sig Dispense Refill  . ALBUTEROL IN Inhale into the lungs as needed.    . benazepril  (LOTENSIN) 20 MG tablet Take 20 mg by mouth daily.  1  . calcium carbonate (OS-CAL) 600 MG TABS tablet Take 600 mg by mouth 2 (two) times daily with a meal.    . cetirizine (ZYRTEC) 10 MG tablet Take 10 mg by mouth at bedtime as needed for allergies.    . cholecalciferol (VITAMIN D) 1000 units tablet Take 1,000 Units by mouth daily.    . fluticasone (FLONASE) 50 MCG/ACT nasal spray Place 1 spray into both nostrils daily.  0  . hydrochlorothiazide (HYDRODIURIL) 25 MG tablet Take 25 mg by mouth daily.  0  . levothyroxine (SYNTHROID, LEVOTHROID) 75 MCG tablet Take 75 mcg by mouth daily.  0  . medroxyPROGESTERone (PROVERA) 5 MG tablet TAKE ONE TABLET (5 MG TOTAL) BY MOUTH DAILY FOR 2 WEEKS - REPEAT EVERY 3 MONTHS AND RECORD MENSTRUAL PERIOD 14 tablet 5  . Melatonin 5 MG TABS Take 1 tablet by mouth at bedtime.    . pravastatin (PRAVACHOL) 20 MG tablet Take 20 mg by mouth daily.  0  . ranitidine (ZANTAC) 150 MG capsule Take 150 mg by mouth daily.      No current facility-administered medications for this visit.     Allergies as of 12/24/2016 - Review Complete 12/24/2016  Allergen Reaction Noted  . Penicillins Shortness Of Breath 03/21/2015    Past Medical History:  Diagnosis Date  . Anemia   . Arthritis   . Hyperlipidemia   . Hypertension   . 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  Sleep apnea    did not tolerate machine  . Thyroid disease     Past Surgical History:  Procedure Laterality Date  . carpel tunnel     right  . CESAREAN SECTION     x 2  . COLONOSCOPY WITH ESOPHAGOGASTRODUODENOSCOPY (EGD)  2008   Dr. Oneida Alar: Sessile polypoid appearing lesion in the midesophagus benign biopsy, normal appearing gastric mucosa, biopsy with chronic gastritis with H pylori. Small bowel looked normal, biopsy negative for celiac. Terminal ileum was normal, colon normal. next TCS 2018.   . iron infusion      . TUBAL LIGATION      Family History  Problem Relation Age of Onset  . Diabetes Mother    . Hypertension Father   . Breast cancer Maternal Grandmother   . Colon cancer Neg Hx     Social History   Social History  . Marital status: Married    Spouse name: N/A  . Number of children: N/A  . Years of education: N/A   Occupational History  . Not on file.   Social History Main Topics  . Smoking status: Never Smoker  . Smokeless tobacco: Never Used  . Alcohol use No  . Drug use: No  . Sexual activity: Not Currently    Birth control/ protection: Surgical   Other Topics Concern  . Not on file   Social History Narrative  . No narrative on file      ROS:  General: Negative for anorexia, weight loss, fever, chills, fatigue, weakness. Eyes: Negative for vision changes.  ENT: Negative for hoarseness, difficulty swallowing , nasal congestion. CV: Negative for chest pain, angina, palpitations, dyspnea on exertion, peripheral edema.  Respiratory: Negative for dyspnea at rest, dyspnea on exertion, cough, sputum, wheezing.  GI: See history of present illness. GU:  Negative for dysuria, hematuria, urinary incontinence, urinary frequency, nocturnal urination.  MS: Negative for joint pain, low back pain.  Derm: Negative for rash or itching.  Neuro: Negative for weakness, abnormal sensation, seizure, frequent headaches, memory loss, confusion.  Psych: Negative for anxiety, depression, suicidal ideation, hallucinations.  Endo: Negative for unusual weight change.  Heme: Negative for bruising or bleeding. Allergy: Negative for rash or hives.    Physical Examination:  BP (!) 154/83   Pulse 89   Temp 98.1 F (36.7 C) (Oral)   Ht 4\' 11"  (1.499 m)   Wt 264 lb (119.7 kg)   BMI 53.32 kg/m    General: Well-nourished, well-developed in no acute distress.  Head: Normocephalic, atraumatic.   Eyes: Conjunctiva pink, no icterus. Mouth: Oropharyngeal mucosa moist and pink , no lesions erythema or exudate. Neck: Supple without thyromegaly, masses, or lymphadenopathy.  Lungs:  Clear to auscultation bilaterally.  Heart: Regular rate and rhythm, no murmurs rubs or gallops.  Abdomen: Bowel sounds are normal, nontender, nondistended, no hepatosplenomegaly or masses, no abdominal bruits or    hernia , no rebound or guarding.   Rectal: Not performed Extremities: No lower extremity edema. No clubbing or deformities.  Neuro: Alert and oriented x 4 , grossly normal neurologically.  Skin: Warm and dry, no rash or jaundice.   Psych: Alert and cooperative, normal mood and affect.  Labs: Lab Results  Component Value Date   WBC 8.1 10/09/2016   HGB 13.5 10/09/2016   HCT 41.8 10/09/2016   MCV 79.5 10/09/2016   PLT 322 10/09/2016   Lab Results  Component Value Date  FERRITIN 9 (L) 10/09/2016   Lab Results  Component Value Date   CREATININE 0.76 02/09/2016   BUN 14 02/09/2016   NA 140 02/09/2016   K 4.0 02/09/2016   CL 103 02/09/2016   CO2 29 02/09/2016   Lab Results  Component Value Date   ALT 18 02/09/2016   AST 19 02/09/2016   ALKPHOS 57 02/09/2016   BILITOT 1.0 02/09/2016      Imaging Studies: No results found.

## 2016-12-24 NOTE — Progress Notes (Signed)
CC'D TO PCP °

## 2016-12-24 NOTE — Assessment & Plan Note (Signed)
56 year old female with history of sleep apnea, obesity, hypertension who presents for further evaluation of iron deficiency anemia requiring iron infusions. Denies heavy menstrual loss in over 9 months. She is due for colonoscopy this year at any rate given her last one was in 2008. Would recommend colonoscopy plus or minus upper endoscopy for further evaluation of iron deficiency anemia. Plan for deep sedation in the OR given history of sleep apnea/anxiety.  I have discussed the risks, alternatives, benefits with regards to but not limited to the risk of reaction to medication, bleeding, infection, perforation and the patient is agreeable to proceed. Written consent to be obtained.  PATIENT HAD H.PYLORI GASTRITIS IN 2008. CANNOT DOCUMENT THAT THIS WAS TREATED. IF EGD NOT PERFORMED, SHE WILL NEED H.PYLORI STOOL ANTIGEN TO DETERMINE IF ERADICATED AS THIS CAN ALSO CONTRIBUTE TO IDA.

## 2016-12-24 NOTE — Telephone Encounter (Signed)
LMOVM and informed pt of pre-op appt 01/02/17 at 12:45pm. Letter also mailed.

## 2016-12-31 NOTE — Patient Instructions (Signed)
FAITH REDDIX  12/31/2016     @PREFPERIOPPHARMACY @   Your procedure is scheduled on 01/08/2017.  Report to [facility] at 7:15 A.M.  Call this number if you have problems the morning of surgery:  [phone]   Remember:  Do not eat food or drink liquids after midnight.  Take these medicines the morning of surgery with A SIP OF WATER Zyrtec, Lotensin, Flonase, Synthroid, Provera, Zantac   Do not wear jewelry, make-up or nail polish.  Do not wear lotions, powders, or perfumes, or deoderant.  Do not shave 48 hours prior to surgery.  Men may shave face and neck.  Do not bring valuables to the hospital.  [facility] is not responsible for any belongings or valuables.  Contacts, dentures or bridgework may not be worn into surgery.  Leave your suitcase in the car.  After surgery it may be brought to your room.  For patients admitted to the hospital, discharge time will be determined by your treatment team.  Patients discharged the day of surgery will not be allowed to drive home.    Please read over the following fact sheets that you were given. Anesthesia Post-op Instructions     PATIENT INSTRUCTIONS POST-ANESTHESIA  IMMEDIATELY FOLLOWING SURGERY:  Do not drive or operate machinery for the first twenty four hours after surgery.  Do not make any important decisions for twenty four hours after surgery or while taking narcotic pain medications or sedatives.  If you develop intractable nausea and vomiting or a severe headache please notify your doctor immediately.  FOLLOW-UP:  Please make an appointment with your surgeon as instructed. You do not need to follow up with anesthesia unless specifically instructed to do so.  WOUND CARE INSTRUCTIONS (if applicable):  Keep a dry clean dressing on the anesthesia/puncture wound site if there is drainage.  Once the wound has quit draining you may leave it open to air.  Generally you should leave the bandage intact for twenty four hours  unless there is drainage.  If the epidural site drains for more than 36-48 hours please call the anesthesia department.  QUESTIONS?:  Please feel free to call your physician or the hospital operator if you have any questions, and they will be happy to assist you.      Colonoscopy, Adult A colonoscopy is an exam to look at the entire large intestine. During the exam, a lubricated, bendable tube is inserted into the anus and then passed into the rectum, colon, and other parts of the large intestine. A colonoscopy is often done as a part of normal colorectal screening or in response to certain symptoms, such as anemia, persistent diarrhea, abdominal pain, and blood in the stool. The exam can help screen for and diagnose medical problems, including:  Tumors.  Polyps.  Inflammation.  Areas of bleeding. Tell a health care provider about:  Any allergies you have.  All medicines you are taking, including vitamins, herbs, eye drops, creams, and over-the-counter medicines.  Any problems you or family members have had with anesthetic medicines.  Any blood disorders you have.  Any surgeries you have had.  Any medical conditions you have.  Any problems you have had passing stool. What are the risks? Generally, this is a safe procedure. However, problems may occur, including:  Bleeding.  A tear in the intestine.  A reaction to medicines given during the exam.  Infection (rare). What happens before the procedure? Eating and drinking restrictions  Follow instructions from your health  care provider about eating and drinking, which may include:  A few days before the procedure - follow a low-fiber diet. Avoid nuts, seeds, dried fruit, raw fruits, and vegetables.  1-3 days before the procedure - follow a clear liquid diet. Drink only clear liquids, such as clear broth or bouillon, black coffee or tea, clear juice, clear soft drinks or sports drinks, gelatin desert, and popsicles. Avoid  any liquids that contain red or purple dye.  On the day of the procedure - do not eat or drink anything during the 2 hours before the procedure, or within the time period that your health care provider recommends. Bowel prep  If you were prescribed an oral bowel prep to clean out your colon:  Take it as told by your health care provider. Starting the day before your procedure, you will need to drink a large amount of medicated liquid. The liquid will cause you to have multiple loose stools until your stool is almost clear or light green.  If your skin or anus gets irritated from diarrhea, you may use these to relieve the irritation:  Medicated wipes, such as adult wet wipes with aloe and vitamin E.  A skin soothing-product like petroleum jelly.  If you vomit while drinking the bowel prep, take a break for up to 60 minutes and then begin the bowel prep again. If vomiting continues and you cannot take the bowel prep without vomiting, call your health care provider. General instructions  Ask your health care provider about changing or stopping your regular medicines. This is especially important if you are taking diabetes medicines or blood thinners.  Plan to have someone take you home from the hospital or clinic. What happens during the procedure?  An IV tube may be inserted into one of your veins.  You will be given medicine to help you relax (sedative).  To reduce your risk of infection:  Your health care team will wash or sanitize their hands.  Your anal area will be washed with soap.  You will be asked to lie on your side with your knees bent.  Your health care provider will lubricate a long, thin, flexible tube. The tube will have a camera and a light on the end.  The tube will be inserted into your anus.  The tube will be gently eased through your rectum and colon.  Air will be delivered into your colon to keep it open. You may feel some pressure or cramping.  The camera  will be used to take images during the procedure.  A small tissue sample may be removed from your body to be examined under a microscope (biopsy). If any potential problems are found, the tissue will be sent to a lab for testing.  If small polyps are found, your health care provider may remove them and have them checked for cancer cells.  The tube that was inserted into your anus will be slowly removed. The procedure may vary among health care providers and hospitals. What happens after the procedure?  Your blood pressure, heart rate, breathing rate, and blood oxygen level will be monitored until the medicines you were given have worn off.  Do not drive for 24 hours after the exam.  You may have a small amount of blood in your stool.  You may pass gas and have mild abdominal cramping or bloating due to the air that was used to inflate your colon during the exam.  It is up to you to get  the results of your procedure. Ask your health care provider, or the department performing the procedure, when your results will be ready. This information is not intended to replace advice given to you by your health care provider. Make sure you discuss any questions you have with your health care provider. Document Released: 10/19/2000 Document Revised: 05/11/2016 Document Reviewed: 01/03/2016 Elsevier Interactive Patient Education  2017 [employer]. Esophagogastroduodenoscopy Introduction Esophagogastroduodenoscopy (EGD) is a procedure to examine the lining of the esophagus, stomach, and first part of the small intestine (duodenum). This procedure is done to check for problems such as inflammation, bleeding, ulcers, or growths. During this procedure, a long, flexible, lighted tube with a camera attached (endoscope) is inserted down the throat. Tell a health care provider about:  Any allergies you have.  All medicines you are taking, including vitamins, herbs, eye drops, creams, and over-the-counter  medicines.  Any problems you or family members have had with anesthetic medicines.  Any blood disorders you have.  Any surgeries you have had.  Any medical conditions you have.  Whether you are pregnant or may be pregnant. What are the risks? Generally, this is a safe procedure. However, problems may occur, including:  Infection.  Bleeding.  A tear (perforation) in the esophagus, stomach, or duodenum.  Trouble breathing.  Excessive sweating.  Spasms of the larynx.  A slowed heartbeat.  Low blood pressure. What happens before the procedure?  Follow instructions from your health care provider about eating or drinking restrictions.  Ask your health care provider about:  Changing or stopping your regular medicines. This is especially important if you are taking diabetes medicines or blood thinners.  Taking medicines such as aspirin and ibuprofen. These medicines can thin your blood. Do not take these medicines before your procedure if your health care provider instructs you not to.  Plan to have someone take you home after the procedure.  If you wear dentures, be ready to remove them before the procedure. What happens during the procedure?  To reduce your risk of infection, your health care team will wash or sanitize their hands.  An IV tube will be put in a vein in your hand or arm. You will get medicines and fluids through this tube.  You will be given one or more of the following:  A medicine to help you relax (sedative).  A medicine to numb the area (local anesthetic). This medicine may be sprayed into your throat. It will make you feel more comfortable and keep you from gagging or coughing during the procedure.  A medicine for pain.  A mouth guard may be placed in your mouth to protect your teeth and to keep you from biting on the endoscope.  You will be asked to lie on your left side.  The endoscope will be lowered down your throat into your esophagus,  stomach, and duodenum.  Air will be put into the endoscope. This will help your health care provider see better.  The lining of your esophagus, stomach, and duodenum will be examined.  Your health care provider may:  Take a tissue sample so it can be looked at in a lab (biopsy).  Remove growths.  Remove objects (foreign bodies) that are stuck.  Treat any bleeding with medicines or other devices that stop tissue from bleeding.  Widen (dilate) or stretch narrowed areas of your esophagus and stomach.  The endoscope will be taken out. The procedure may vary among health care providers and hospitals. What happens after the  procedure?  Your blood pressure, heart rate, breathing rate, and blood oxygen level will be monitored often until the medicines you were given have worn off.  Do not eat or drink anything until the numbing medicine has worn off and your gag reflex has returned. This information is not intended to replace advice given to you by your health care provider. Make sure you discuss any questions you have with your health care provider. Document Released: 02/22/2005 Document Revised: 03/29/2016 Document Reviewed: 09/15/2015  2017 Elsevier

## 2017-01-02 ENCOUNTER — Encounter (HOSPITAL_COMMUNITY)
Admission: RE | Admit: 2017-01-02 | Discharge: 2017-01-02 | Disposition: A | Discharge status: Home / Self Care | Payer: BLUE CROSS/BLUE SHIELD | Source: Ambulatory Visit | Attending: Gastroenterology | Admitting: Gastroenterology

## 2017-01-02 ENCOUNTER — Encounter (HOSPITAL_COMMUNITY): Payer: Self-pay

## 2017-01-02 ENCOUNTER — Telehealth: Payer: Self-pay | Admitting: Gastroenterology

## 2017-01-02 DIAGNOSIS — I10 Essential (primary) hypertension: Secondary | ICD-10-CM | POA: Diagnosis not present

## 2017-01-02 DIAGNOSIS — G4730 Sleep apnea, unspecified: Secondary | ICD-10-CM | POA: Diagnosis not present

## 2017-01-02 DIAGNOSIS — Z01812 Encounter for preprocedural laboratory examination: Secondary | ICD-10-CM | POA: Insufficient documentation

## 2017-01-02 DIAGNOSIS — D509 Iron deficiency anemia, unspecified: Secondary | ICD-10-CM | POA: Diagnosis not present

## 2017-01-02 DIAGNOSIS — Z01810 Encounter for preprocedural cardiovascular examination: Secondary | ICD-10-CM | POA: Diagnosis not present

## 2017-01-02 DIAGNOSIS — R9431 Abnormal electrocardiogram [ECG] [EKG]: Secondary | ICD-10-CM | POA: Diagnosis not present

## 2017-01-02 HISTORY — DX: Unspecified asthma, uncomplicated: J45.909

## 2017-01-02 HISTORY — DX: Gastro-esophageal reflux disease without esophagitis: K21.9

## 2017-01-02 HISTORY — DX: Hypothyroidism, unspecified: E03.9

## 2017-01-02 LAB — CBC WITH DIFFERENTIAL/PLATELET
BASOS PCT: 0 %
Basophils Absolute: 0 10*3/uL (ref 0.0–0.1)
Eosinophils Absolute: 0.3 10*3/uL (ref 0.0–0.7)
Eosinophils Relative: 5 %
HEMATOCRIT: 44.3 % (ref 36.0–46.0)
HEMOGLOBIN: 14.7 g/dL (ref 12.0–15.0)
LYMPHS ABS: 2.6 10*3/uL (ref 0.7–4.0)
Lymphocytes Relative: 35 %
MCH: 27 pg (ref 26.0–34.0)
MCHC: 33.2 g/dL (ref 30.0–36.0)
MCV: 81.3 fL (ref 78.0–100.0)
Monocytes Absolute: 0.6 10*3/uL (ref 0.1–1.0)
Monocytes Relative: 8 %
NEUTROS ABS: 4 10*3/uL (ref 1.7–7.7)
NEUTROS PCT: 52 %
Platelets: 306 10*3/uL (ref 150–400)
RBC: 5.45 MIL/uL — AB (ref 3.87–5.11)
RDW: 15.4 % (ref 11.5–15.5)
WBC: 7.6 10*3/uL (ref 4.0–10.5)

## 2017-01-02 LAB — BASIC METABOLIC PANEL WITH GFR
Anion gap: 7 (ref 5–15)
BUN: 14 mg/dL (ref 6–20)
CO2: 28 mmol/L (ref 22–32)
Calcium: 9 mg/dL (ref 8.9–10.3)
Chloride: 100 mmol/L — ABNORMAL LOW (ref 101–111)
Creatinine, Ser: 0.61 mg/dL (ref 0.44–1.00)
GFR calc Af Amer: >60 mL/min
GFR calc non Af Amer: >60 mL/min
Glucose, Bld: 102 mg/dL — ABNORMAL HIGH (ref 65–99)
Potassium: 3.3 mmol/L — ABNORMAL LOW (ref 3.5–5.1)
Sodium: 135 mmol/L (ref 135–145)

## 2017-01-02 LAB — HCG, SERUM, QUALITATIVE: Preg, Serum: NEGATIVE

## 2017-01-02 MED ORDER — POTASSIUM CHLORIDE ER 20 MEQ PO TBCR: EXTENDED_RELEASE_TABLET | ORAL | 0 refills | Status: DC

## 2017-01-02 NOTE — Telephone Encounter (Signed)
PLEASE CALL PT. HER potassium is slightly low. It is due to your HCTZ. SHE SHOULD HOLD THE HCTZ ON MAR 5 AND MAR 6. I SENT A PRESCRIPTION FOR POTASSIUM TO YOUR PHARMACY. HER BLOOD COUNT IS NORMAL.

## 2017-01-07 NOTE — Telephone Encounter (Signed)
LM for pt to call

## 2017-01-07 NOTE — Telephone Encounter (Signed)
PLEASE CALL PT. She should drink 1 cup of grape juice three times today.

## 2017-01-07 NOTE — Telephone Encounter (Signed)
PT is aware. She picked up the potassium and took one Sat and got very sick to her stomach and her stomach hurt really bad for about 2 hours. So she did not take anymore.  She is aware not to take the HCTZ today and tomorrow ( 3/5/ and 3/6/).  She said if Dr. Oneida Alar needs her to try the potassium again after her procedure she will try.

## 2017-01-07 NOTE — Telephone Encounter (Signed)
Pt is aware.  

## 2017-01-08 ENCOUNTER — Encounter (HOSPITAL_COMMUNITY): Payer: Self-pay | Admitting: *Deleted

## 2017-01-08 ENCOUNTER — Ambulatory Visit (HOSPITAL_COMMUNITY): Payer: BLUE CROSS/BLUE SHIELD | Admitting: Anesthesiology

## 2017-01-08 ENCOUNTER — Encounter (HOSPITAL_COMMUNITY)
Admission: RE | Disposition: A | Discharge status: Home / Self Care | Payer: Self-pay | Source: Ambulatory Visit | Attending: Gastroenterology

## 2017-01-08 ENCOUNTER — Ambulatory Visit (HOSPITAL_COMMUNITY)
Admission: RE | Admit: 2017-01-08 | Discharge: 2017-01-08 | Disposition: A | Discharge status: Home / Self Care | Payer: BLUE CROSS/BLUE SHIELD | Source: Ambulatory Visit | Attending: Gastroenterology | Admitting: Gastroenterology

## 2017-01-08 DIAGNOSIS — D124 Benign neoplasm of descending colon: Secondary | ICD-10-CM | POA: Insufficient documentation

## 2017-01-08 DIAGNOSIS — Z880 Allergy status to penicillin: Secondary | ICD-10-CM | POA: Insufficient documentation

## 2017-01-08 DIAGNOSIS — J45909 Unspecified asthma, uncomplicated: Secondary | ICD-10-CM | POA: Insufficient documentation

## 2017-01-08 DIAGNOSIS — D509 Iron deficiency anemia, unspecified: Secondary | ICD-10-CM

## 2017-01-08 DIAGNOSIS — D123 Benign neoplasm of transverse colon: Secondary | ICD-10-CM | POA: Insufficient documentation

## 2017-01-08 DIAGNOSIS — K2970 Gastritis, unspecified, without bleeding: Secondary | ICD-10-CM

## 2017-01-08 DIAGNOSIS — Z803 Family history of malignant neoplasm of breast: Secondary | ICD-10-CM | POA: Insufficient documentation

## 2017-01-08 DIAGNOSIS — D130 Benign neoplasm of esophagus: Secondary | ICD-10-CM | POA: Diagnosis not present

## 2017-01-08 DIAGNOSIS — I10 Essential (primary) hypertension: Secondary | ICD-10-CM | POA: Diagnosis not present

## 2017-01-08 DIAGNOSIS — Z79899 Other long term (current) drug therapy: Secondary | ICD-10-CM | POA: Insufficient documentation

## 2017-01-08 DIAGNOSIS — D122 Benign neoplasm of ascending colon: Secondary | ICD-10-CM | POA: Insufficient documentation

## 2017-01-08 DIAGNOSIS — E039 Hypothyroidism, unspecified: Secondary | ICD-10-CM | POA: Diagnosis not present

## 2017-01-08 DIAGNOSIS — Z8249 Family history of ischemic heart disease and other diseases of the circulatory system: Secondary | ICD-10-CM | POA: Diagnosis not present

## 2017-01-08 DIAGNOSIS — K648 Other hemorrhoids: Secondary | ICD-10-CM | POA: Insufficient documentation

## 2017-01-08 DIAGNOSIS — K644 Residual hemorrhoidal skin tags: Secondary | ICD-10-CM | POA: Insufficient documentation

## 2017-01-08 DIAGNOSIS — K317 Polyp of stomach and duodenum: Secondary | ICD-10-CM | POA: Insufficient documentation

## 2017-01-08 DIAGNOSIS — G4730 Sleep apnea, unspecified: Secondary | ICD-10-CM | POA: Insufficient documentation

## 2017-01-08 DIAGNOSIS — Q438 Other specified congenital malformations of intestine: Secondary | ICD-10-CM | POA: Diagnosis not present

## 2017-01-08 DIAGNOSIS — K2950 Unspecified chronic gastritis without bleeding: Secondary | ICD-10-CM | POA: Insufficient documentation

## 2017-01-08 DIAGNOSIS — Z833 Family history of diabetes mellitus: Secondary | ICD-10-CM | POA: Diagnosis not present

## 2017-01-08 DIAGNOSIS — D120 Benign neoplasm of cecum: Secondary | ICD-10-CM | POA: Insufficient documentation

## 2017-01-08 DIAGNOSIS — K219 Gastro-esophageal reflux disease without esophagitis: Secondary | ICD-10-CM | POA: Diagnosis not present

## 2017-01-08 DIAGNOSIS — E785 Hyperlipidemia, unspecified: Secondary | ICD-10-CM | POA: Insufficient documentation

## 2017-01-08 HISTORY — PX: ESOPHAGOGASTRODUODENOSCOPY (EGD) WITH PROPOFOL: SHX5813

## 2017-01-08 HISTORY — PX: POLYPECTOMY: SHX5525

## 2017-01-08 HISTORY — PX: BIOPSY: SHX5522

## 2017-01-08 HISTORY — PX: COLONOSCOPY WITH PROPOFOL: SHX5780

## 2017-01-08 SURGERY — COLONOSCOPY WITH PROPOFOL
Anesthesia: Monitor Anesthesia Care

## 2017-01-08 MED ORDER — LIDOCAINE HCL (PF) 1 % IJ SOLN
INTRAMUSCULAR | Status: AC
  Filled 2017-01-08: qty 5

## 2017-01-08 MED ORDER — MIDAZOLAM HCL 2 MG/2ML IJ SOLN
1.0000 mg | INTRAMUSCULAR | Status: AC
  Administered 2017-01-08: 2 mg via INTRAVENOUS

## 2017-01-08 MED ORDER — CHLORHEXIDINE GLUCONATE CLOTH 2 % EX PADS: 6.0000 | MEDICATED_PAD | Freq: Once | CUTANEOUS | Status: DC

## 2017-01-08 MED ORDER — LACTATED RINGERS IV SOLN
INTRAVENOUS | Status: DC
  Administered 2017-01-08: 08:00:00 via INTRAVENOUS

## 2017-01-08 MED ORDER — SIMETHICONE 40 MG/0.6ML PO SUSP
ORAL | Status: DC | PRN
  Administered 2017-01-08: 200 mL

## 2017-01-08 MED ORDER — LIDOCAINE VISCOUS 2 % MT SOLN
5.0000 mL | OROMUCOSAL | Status: AC
  Administered 2017-01-08: 5 mL via OROMUCOSAL

## 2017-01-08 MED ORDER — PROPOFOL 10 MG/ML IV BOLUS
INTRAVENOUS | Status: AC
  Filled 2017-01-08: qty 40

## 2017-01-08 MED ORDER — MIDAZOLAM HCL 2 MG/2ML IJ SOLN
INTRAMUSCULAR | Status: AC
  Filled 2017-01-08: qty 2

## 2017-01-08 MED ORDER — FENTANYL CITRATE (PF) 100 MCG/2ML IJ SOLN
25.0000 ug | Freq: Once | INTRAMUSCULAR | Status: AC
  Administered 2017-01-08: 25 ug via INTRAVENOUS

## 2017-01-08 MED ORDER — PROPOFOL 500 MG/50ML IV EMUL
INTRAVENOUS | Status: DC | PRN
  Administered 2017-01-08: 75 ug/kg/min via INTRAVENOUS
  Administered 2017-01-08: 100 ug/kg/min via INTRAVENOUS
  Administered 2017-01-08: 95 ug/kg/min via INTRAVENOUS

## 2017-01-08 MED ORDER — LIDOCAINE VISCOUS 2 % MT SOLN
OROMUCOSAL | Status: AC
  Filled 2017-01-08: qty 15

## 2017-01-08 MED ORDER — FENTANYL CITRATE (PF) 100 MCG/2ML IJ SOLN
INTRAMUSCULAR | Status: AC
  Filled 2017-01-08: qty 2

## 2017-01-08 MED ORDER — LIDOCAINE HCL (CARDIAC) 10 MG/ML IV SOLN
INTRAVENOUS | Status: DC | PRN
  Administered 2017-01-08: 50 mg via INTRAVENOUS

## 2017-01-08 MED ORDER — PROPOFOL 10 MG/ML IV BOLUS
INTRAVENOUS | Status: AC
  Filled 2017-01-08: qty 20

## 2017-01-08 MED ORDER — PROPOFOL 10 MG/ML IV BOLUS
INTRAVENOUS | Status: DC | PRN
  Administered 2017-01-08: 10 mg via INTRAVENOUS
  Administered 2017-01-08: 20 mg via INTRAVENOUS
  Administered 2017-01-08: 12 mg via INTRAVENOUS

## 2017-01-08 NOTE — Transfer of Care (Signed)
Immediate Anesthesia Transfer of Care Note  Patient: Christine Foley  Procedure(s) Performed: Procedure(s) with comments: COLONOSCOPY WITH PROPOFOL (N/A) - 8:45AM ESOPHAGOGASTRODUODENOSCOPY (EGD) WITH PROPOFOL (N/A) BIOPSY - cecal polyps x2, ascending colon polyp, duodenal bx's , gastric bx's , gastric polyps bx POLYPECTOMY - distal transverse colon polyp  Patient Location: PACU  Anesthesia Type:MAC  Level of Consciousness: awake and patient cooperative  Airway & Oxygen Therapy: Patient Spontanous Breathing  Post-op Assessment: Report given to RN and Post -op Vital signs reviewed and stable  Post vital signs: Reviewed and stable  Last Vitals:  Vitals:   01/08/17 0830 01/08/17 0835  BP: 133/68 131/80  Pulse:    Resp: 14 13  Temp:      Last Pain:  Vitals:   01/08/17 0740  TempSrc: Oral      Patients Stated Pain Goal: 5 (51/83/43 [id number])  Complications: No apparent anesthesia complications

## 2017-01-08 NOTE — Anesthesia Postprocedure Evaluation (Signed)
Anesthesia Post Note  Patient: Christine Foley  Procedure(s) Performed: Procedure(s) (LRB): COLONOSCOPY WITH PROPOFOL (N/A) ESOPHAGOGASTRODUODENOSCOPY (EGD) WITH PROPOFOL (N/A) BIOPSY POLYPECTOMY  Patient location during evaluation: PACU Anesthesia Type: MAC Level of consciousness: awake and patient cooperative Pain management: pain level controlled Vital Signs Assessment: post-procedure vital signs reviewed and stable Respiratory status: spontaneous breathing Cardiovascular status: stable Anesthetic complications: no     Last Vitals:  Vitals:   01/08/17 0945 01/08/17 0952  BP: (!) 120/93 128/75  Pulse: (!) 104 (!) 104  Resp: 13 18  Temp:  36.7 C    Last Pain:  Vitals:   01/08/17 0952  TempSrc: Oral                 Itha Kroeker

## 2017-01-08 NOTE — Op Note (Signed)
[facility] Patient Name: Christine Foley Procedure Date: 01/08/2017 9:15 AM MRN: [id number] Date of Birth: May 15, 1961 Attending MD: Barney Drain , MD CSN: [id number] Age: 56 Admit Type: Outpatient Procedure:                Upper GI endoscopy WITH COLD FORCEPS BIOPSY Indications:              Unexplained iron deficiency anemia Providers:                Barney Drain, MD, Rosina Lowenstein, RN, Hinton Rao,                            RN, Lynwood [place], [employer] Referring MD:             Abran Richard Medicines:                Propofol per Anesthesia Complications:            No immediate complications. Estimated Blood Loss:     Estimated blood loss was minimal. Procedure:                Pre-Anesthesia Assessment:                           - Prior to the procedure, a History and Physical                            was performed, and patient medications and                            allergies were reviewed. The patient's tolerance of                            previous anesthesia was also reviewed. The risks                            and benefits of the procedure and the sedation                            options and risks were discussed with the patient.                            All questions were answered, and informed consent                            was obtained. Prior Anticoagulants: The patient has                            taken no previous anticoagulant or antiplatelet                            agents. ASA Grade Assessment: II - A patient with                            mild systemic disease. After reviewing the risks  and benefits, the patient was deemed in                            satisfactory condition to undergo the procedure.                            After obtaining informed consent, the endoscope was                            passed under direct vision. Throughout the                            procedure, the patient's blood  pressure, pulse, and                            oxygen saturations were monitored continuously. The                            [id number] scope was introduced through the                            mouth, and advanced to the second part of duodenum.                            The upper GI endoscopy was accomplished without                            difficulty. The patient tolerated the procedure                            well. Scope In: 9:17:56 AM Scope Out: 9:28:44 AM Total Procedure Duration: 0 hours 10 minutes 48 seconds  Findings:      A single 3 mm mucosal nodule was found in the mid esophagus. The polyp       was removed with a cold biopsy forceps. Resection and retrieval were       complete.      A single 6 mm sessile polyp was found in the gastric body. The polyp was       removed with a cold biopsy forceps. Resection and retrieval were       complete.      Patchy mild inflammation characterized by congestion (edema) and       erythema was found in the gastric body and in the gastric antrum.       Biopsies were taken with a cold forceps for Helicobacter pylori testing.      The examined duodenum was normal. Biopsies for histology were taken with       a cold forceps for evaluation of celiac disease. Impression:               - Mucosal nodule found in the esophagus.                           - A single gastric polyp.                           -  MILD Gastritis. Biopsied.                           - NO SOURCE FOR IRON DEFICIENCY ANEMIA IDENTIFIED Moderate Sedation:      Per Anesthesia Care Recommendation:           - High fiber diet.                           - Continue present medications.                           - Await pathology results.                           - Return to my office in 4 months.                           - Patient has a contact number available for                            emergencies. The signs and symptoms of potential                             delayed complications were discussed with the                            patient. Return to normal activities tomorrow.                            Written discharge instructions were provided to the                            patient. Procedure Code(s):        --- Professional ---                           [phone], Esophagogastroduodenoscopy, flexible,                            transoral; with biopsy, single or multiple Diagnosis Code(s):        --- Professional ---                           K22.8, Other specified diseases of esophagus                           K31.7, Polyp of stomach and duodenum                           K29.70, Gastritis, unspecified, without bleeding                           D50.9, Iron deficiency anemia, unspecified CPT copyright 2016 American Medical Association. All rights reserved. The codes documented in this report are preliminary and upon coder review may  be revised to meet current compliance requirements. Barney Drain, MD Barney Drain,  MD 01/08/2017 9:43:22 AM This report has been signed electronically. Number of Addenda: 0

## 2017-01-08 NOTE — Op Note (Signed)
[facility] Patient Name: Christine Foley Procedure Date: 01/08/2017 8:43 AM MRN: [id number] Date of Birth: 1960/12/29 Attending MD: Barney Drain , MD CSN: [id number] Age: 56 Admit Type: Outpatient Procedure:                Colonoscopy with COLD FORCEPS/SNARE POLYPECTOMY Indications:              Unexplained iron deficiency anemia Providers:                Barney Drain, MD, Hinton Rao, RN, Tammy Vaught,                            RN, Purcell Nails. [place], [employer] Referring MD:             Abran Richard Medicines:                Propofol per Anesthesia Complications:            No immediate complications. Estimated Blood Loss:     Estimated blood loss was minimal. Procedure:                Pre-Anesthesia Assessment:                           - Prior to the procedure, a History and Physical                            was performed, and patient medications and                            allergies were reviewed. The patient's tolerance of                            previous anesthesia was also reviewed. The risks                            and benefits of the procedure and the sedation                            options and risks were discussed with the patient.                            All questions were answered, and informed consent                            was obtained. Prior Anticoagulants: The patient has                            taken no previous anticoagulant or antiplatelet                            agents. ASA Grade Assessment: II - A patient with                            mild systemic disease. After reviewing the risks  and benefits, the patient was deemed in                            satisfactory condition to undergo the procedure.                            After obtaining informed consent, the colonoscope                            was passed under direct vision. Throughout the                            procedure, the patient's blood  pressure, pulse, and                            oxygen saturations were monitored continuously. The                            EC-[id number] scope was introduced through                            the anus and advanced to the 10 cm into the ileum.                            The colonoscopy was somewhat difficult due to a                            tortuous colon. Successful completion of the                            procedure was aided by COLOWRAP. The patient                            tolerated the procedure well. The quality of the                            bowel preparation was excellent. The terminal                            ileum, ileocecal valve, appendiceal orifice, and                            rectum were photographed. Scope In: 8:56:50 AM Scope Out: 9:12:41 AM Scope Withdrawal Time: 0 hours 13 minutes 53 seconds  Total Procedure Duration: 0 hours 15 minutes 51 seconds  Findings:      The terminal ileum appeared normal.      Three sessile polyps were found in the ascending colon and cecum. The       polyps were 2 to 3 mm in size. These polyps were removed with a cold       biopsy forceps. Resection and retrieval were complete.      A 6 mm polyp was found in the distal descending colon. The polyp was       sessile. The polyp was removed with  a hot snare. Resection and retrieval       were complete.      The recto-sigmoid colon and sigmoid colon were mildly redundant.      External and internal hemorrhoids were found during retroflexion. Impression:               - NO SOURCE FOR IRON DEFICIENCY ANEMIA IDENTIFIED.                           - Three 2 to 3 mm polyps in the ascending colon and                            in the cecum, removed with a cold biopsy forceps.                            Resected and retrieved.                           - One 6 mm polyp in the distal descending colon,                            removed with a hot snare. Resected and retrieved.                            - Redundant LEFT colon.                           - External and internal hemorrhoids. Moderate Sedation:      Per Anesthesia Care Recommendation:           - High fiber diet.                           - Continue present medications.                           - Await pathology results.                           - Repeat colonoscopy in 5-10 years for surveillance.                           - Patient has a contact number available for                            emergencies. The signs and symptoms of potential                            delayed complications were discussed with the                            patient. Return to normal activities tomorrow.                            Written discharge instructions were provided to the  patient. Procedure Code(s):        --- Professional ---                           [id number], Colonoscopy, flexible; with removal of                            tumor(s), polyp(s), or other lesion(s) by snare                            technique                           45380, 35, Colonoscopy, flexible; with biopsy,                            single or multiple Diagnosis Code(s):        --- Professional ---                           K64.8, Other hemorrhoids                           D12.2, Benign neoplasm of ascending colon                           D12.0, Benign neoplasm of cecum                           D12.4, Benign neoplasm of descending colon                           D50.9, Iron deficiency anemia, unspecified                           Q43.8, Other specified congenital malformations of                            intestine CPT copyright 2016 American Medical Association. All rights reserved. The codes documented in this report are preliminary and upon coder review may  be revised to meet current compliance requirements. Barney Drain, MD Barney Drain, MD 01/08/2017 9:38:47 AM This report has been signed  electronically. Number of Addenda: 0

## 2017-01-08 NOTE — Discharge Instructions (Signed)
You had 4 polyps removed. You have internal hemorrhoids. You have gastritis, STOMACH POLYPS, & I removed an esophageal polyp. I biopsied your stomach, & small bowel.  CONTINUE YOUR WEIGHT LOSS EFFORTS. If you CAN'T DO IT WITH DIET AND EXERCISE YOU SHOULD CONSIDER WEIGHT LOSS SURGERY.  WHILE I DO NOT WANT TO ALARM YOU, YOUR BODY MASS INDEX IS OVER 50 WHICH MEANS YOU ARE SUPER MORBIDLY OBESE. OBESITY IS ASSOCIATED WITH AN INCREASE RISK FOR ALL CANCERS, INCLUDING ESOPHAGEAL AND COLON CANCER.  AVOID ITEMS THAT TRIGGER GASTRITIS. SEE INFO BELOW.  FOLLOW A HIGH FIBER/LOW FAT DIET. AVOID ITEMS THAT CAUSE BLOATING. SEE INFO BELOW.  YOUR BIOPSY RESULTS WILL BE AVAILABLE IN MY CHART AFTER MAR 9 AND MY OFFICE WILL CONTACT YOU IN 10-14 DAYS WITH YOUR RESULTS.   Next colonoscopy in 5-10 years.   ENDOSCOPY Care After Read the instructions outlined below and refer to this sheet in the next week. These discharge instructions provide you with general information on caring for yourself after you leave the hospital. While your treatment has been planned according to the most current medical practices available, unavoidable complications occasionally occur. If you have any problems or questions after discharge, call DR. Elad Macphail, [phone].  ACTIVITY  You may resume your regular activity, but move at a slower pace for the next 24 hours.   Take frequent rest periods for the next 24 hours.   Walking will help get rid of the air and reduce the bloated feeling in your belly (abdomen).   No driving for 24 hours (because of the medicine (anesthesia) used during the test).   You may shower.   Do not sign any important legal documents or operate any machinery for 24 hours (because of the anesthesia used during the test).    NUTRITION  Drink plenty of fluids.   You may resume your normal diet as instructed by your doctor.   Begin with a light meal and progress to your normal diet. Heavy or fried foods are  harder to digest and may make you feel sick to your stomach (nauseated).   Avoid alcoholic beverages for 24 hours or as instructed.    MEDICATIONS  You may resume your normal medications.   WHAT YOU CAN EXPECT TODAY  Some feelings of bloating in the abdomen.   Passage of more gas than usual.   Spotting of blood in your stool or on the toilet paper  .  IF YOU HAD POLYPS REMOVED DURING THE ENDOSCOPY:  Eat a soft diet IF YOU HAVE NAUSEA, BLOATING, ABDOMINAL PAIN, OR VOMITING.    FINDING OUT THE RESULTS OF YOUR TEST Not all test results are available during your visit. DR. Oneida Alar WILL CALL YOU WITHIN 14 DAYS OF YOUR PROCEDUE WITH YOUR RESULTS. Do not assume everything is normal if you have not heard from DR. Ryelle Ruvalcaba, CALL HER OFFICE AT [phone].  SEEK IMMEDIATE MEDICAL ATTENTION AND CALL THE OFFICE: [phone] IF:  You have more than a spotting of blood in your stool.   Your belly is swollen (abdominal distention).   You are nauseated or vomiting.   You have a temperature over 101F.   You have abdominal pain or discomfort that is severe or gets worse throughout the day.   Gastritis  Gastritis is an inflammation (the body's way of reacting to injury and/or infection) of the stomach. It is often caused by viral or bacterial (germ) infections. It can also be caused BY ASPIRIN, BC/GOODY POWDER'S, (IBUPROFEN) MOTRIN, OR ALEVE (NAPROXEN),  chemicals (including alcohol), SPICY FOODS, and medications. This illness may be associated with generalized malaise (feeling tired, not well), UPPER ABDOMINAL STOMACH cramps, and fever. One common bacterial cause of gastritis is an organism known as H. Pylori. This can be treated with antibiotics.    Hiatal Hernia A hiatal hernia occurs when a part of the stomach slides above the diaphragm. The diaphragm is the thin muscle separating the belly (abdomen) from the chest. A hiatal hernia can be something you are born with or develop over time.  Hiatal hernias may allow stomach acid to flow back into your esophagus, the tube which carries food from your mouth to your stomach. If this acid causes problems it is called GERD (gastro-esophageal reflux disease).   SYMPTOMS Common symptoms of GERD are heartburn (burning in your chest). This is worse when lying down or bending over. It may also cause belching and indigestion. Some of the things which make GERD worse are:  Increased weight pushes on stomach making acid rise more easily.   Smoking markedly increases acid production.   Alcohol decreases lower esophageal sphincter pressure (valve between stomach and esophagus), allowing acid from stomach into esophagus.   Late evening meals and going to bed with a full stomach increases pressure.   Anything that causes an increase in acid production.    HOME CARE INSTRUCTIONS  Try to achieve and maintain an ideal body weight.   Avoid drinking alcoholic beverages.   DO NOT smokE.   Do not wear tight clothing around your chest or stomach.   Eat smaller meals and eat more frequently. This keeps your stomach from getting too full. Eat slowly.   Do not lie down for 2 or 3 hours after eating. Do not eat or drink anything 1 to 2 hours before going to bed.   Avoid caffeine beverages (colas, coffee, cocoa, tea), fatty foods, citrus fruits and all other foods and drinks that contain acid and that seem to increase the problems.   Avoid bending over, especially after eating OR STRAINING. Anything that increases the pressure in your belly increases the amount of acid that may be pushed up into your esophagus.    High-Fiber Diet A high-fiber diet changes your normal diet to include more whole grains, legumes, fruits, and vegetables. Changes in the diet involve replacing refined carbohydrates with unrefined foods. The calorie level of the diet is essentially unchanged. The Dietary Reference Intake (recommended amount) for adult males is 38 grams  per day. For adult females, it is 25 grams per day. Pregnant and lactating women should consume 28 grams of fiber per day. Fiber is the intact part of a plant that is not broken down during digestion. Functional fiber is fiber that has been isolated from the plant to provide a beneficial effect in the body. PURPOSE  Increase stool bulk.   Ease and regulate bowel movements.   Lower cholesterol.  INDICATIONS THAT YOU NEED MORE FIBER  Constipation and hemorrhoids.   Uncomplicated diverticulosis (intestine condition) and irritable bowel syndrome.   Weight management.   As a protective measure against hardening of the arteries (atherosclerosis), diabetes, and cancer.   GUIDELINES FOR INCREASING FIBER IN THE DIET  Start adding fiber to the diet slowly. A gradual increase of about 5 more grams (2 slices of whole-wheat bread, 2 servings of most fruits or vegetables, or 1 bowl of high-fiber cereal) per day is best. Too rapid an increase in fiber may result in constipation, flatulence, and bloating.  Drink enough water and fluids to keep your urine clear or pale yellow. Water, juice, or caffeine-free drinks are recommended. Not drinking enough fluid may cause constipation.   Eat a variety of high-fiber foods rather than one type of fiber.   Try to increase your intake of fiber through using high-fiber foods rather than fiber pills or supplements that contain small amounts of fiber.   The goal is to change the types of food eaten. Do not supplement your present diet with high-fiber foods, but replace foods in your present diet.  INCLUDE A VARIETY OF FIBER SOURCES  Replace refined and processed grains with whole grains, canned fruits with fresh fruits, and incorporate other fiber sources. White rice, white breads, and most bakery goods contain little or no fiber.   Brown whole-grain rice, buckwheat oats, and many fruits and vegetables are all good sources of fiber. These include: broccoli,  Brussels sprouts, cabbage, cauliflower, beets, sweet potatoes, white potatoes (skin on), carrots, tomatoes, eggplant, squash, berries, fresh fruits, and dried fruits.   Cereals appear to be the richest source of fiber. Cereal fiber is found in whole grains and bran. Bran is the fiber-rich outer coat of cereal grain, which is largely removed in refining. In whole-grain cereals, the bran remains. In breakfast cereals, the largest amount of fiber is found in those with "bran" in their names. The fiber content is sometimes indicated on the label.   You may need to include additional fruits and vegetables each day.   In baking, for 1 cup white flour, you may use the following substitutions:   1 cup whole-wheat flour minus 2 tablespoons.   1/2 cup white flour plus 1/2 cup whole-wheat flour.   Low-Fat Diet BREADS, CEREALS, PASTA, RICE, DRIED PEAS, AND BEANS These products are high in carbohydrates and most are low in fat. Therefore, they can be increased in the diet as substitutes for fatty foods. They too, however, contain calories and should not be eaten in excess. Cereals can be eaten for snacks as well as for breakfast.  Include foods that contain fiber (fruits, vegetables, whole grains, and legumes). Research shows that fiber may lower blood cholesterol levels, especially the water-soluble fiber found in fruits, vegetables, oat products, and legumes. FRUITS AND VEGETABLES It is good to eat fruits and vegetables. Besides being sources of fiber, both are rich in vitamins and some minerals. They help you get the daily allowances of these nutrients. Fruits and vegetables can be used for snacks and desserts. MEATS Limit lean meat, chicken, [place], and fish to no more than 6 ounces per day. Beef, Pork, and Lamb Use lean cuts of beef, pork, and lamb. Lean cuts include:  Extra-lean ground beef.  Arm roast.  Sirloin tip.  Center-cut ham.  Round steak.  Loin chops.  Rump roast.  Tenderloin.  Trim  all fat off the outside of meats before cooking. It is not necessary to severely decrease the intake of red meat, but lean choices should be made. Lean meat is rich in protein and contains a highly absorbable form of iron. Premenopausal women, in particular, should avoid reducing lean red meat because this could increase the risk for low red blood cells (iron-deficiency anemia). The organ meats, such as liver, sweetbreads, kidneys, and brain are very rich in cholesterol. They should be limited. Chicken and [place] These are good sources of protein. The fat of poultry can be reduced by removing the skin and underlying fat layers before cooking. Chicken and [place] can be  substituted for lean red meat in the diet. Poultry should not be fried or covered with high-fat sauces. Fish and Shellfish Fish is a good source of protein. Shellfish contain cholesterol, but they usually are low in saturated fatty acids. The preparation of fish is important. Like chicken and [place], they should not be fried or covered with high-fat sauces. EGGS Egg whites contain no fat or cholesterol. They can be eaten often. Try 1 to 2 egg whites instead of whole eggs in recipes or use egg substitutes that do not contain yolk. MILK AND DAIRY PRODUCTS Use skim or 1% milk instead of 2% or whole milk. Decrease whole milk, natural, and processed cheeses. Use nonfat or low-fat (2%) cottage cheese or low-fat cheeses made from vegetable oils. Choose nonfat or low-fat (1 to 2%) yogurt. Experiment with evaporated skim milk in recipes that call for heavy cream. Substitute low-fat yogurt or low-fat cottage cheese for sour cream in dips and salad dressings. Have at least 2 servings of low-fat dairy products, such as 2 glasses of skim (or 1%) milk each day to help get your daily calcium intake.  FATS AND OILS Reduce the total intake of fats, especially saturated fat. Butterfat, lard, and beef fats are high in saturated fat and cholesterol. These  should be avoided as much as possible. Vegetable fats do not contain cholesterol, but certain vegetable fats, such as coconut oil, palm oil, and palm kernel oil are very high in saturated fats. These should be limited. These fats are often used in bakery goods, processed foods, popcorn, oils, and nondairy creamers. Vegetable shortenings and some peanut butters contain hydrogenated oils, which are also saturated fats. Read the labels on these foods and check for saturated vegetable oils. Unsaturated vegetable oils and fats do not raise blood cholesterol. However, they should be limited because they are fats and are high in calories. Total fat should still be limited to 30% of your daily caloric intake. Desirable liquid vegetable oils are corn oil, cottonseed oil, olive oil, canola oil, safflower oil, soybean oil, and sunflower oil. Peanut oil is not as good, but small amounts are acceptable. Buy a heart-healthy tub margarine that has no partially hydrogenated oils in the ingredients. Mayonnaise and salad dressings often are made from unsaturated fats, but they should also be limited because of their high calorie and fat content. Seeds, nuts, peanut butter, olives, and avocados are high in fat, but the fat is mainly the unsaturated type. These foods should be limited mainly to avoid excess calories and fat. OTHER EATING TIPS Snacks  Most sweets should be limited as snacks. They tend to be rich in calories and fats, and their caloric content outweighs their nutritional value. Some good choices in snacks are graham crackers, melba toast, soda crackers, bagels (no egg), English muffins, fruits, and vegetables. These snacks are preferable to snack crackers, [place] fries, and chips. Popcorn should be air-popped or cooked in small amounts of liquid vegetable oil. Desserts Eat fruit, low-fat yogurt, and fruit ices. AVOID pastries, cake, and cookies. Sherbet, angel food cake, gelatin dessert, frozen low-fat yogurt, or  other frozen products that do not contain saturated fat (pure fruit juice bars, frozen ice pops) are also acceptable.  COOKING METHODS Choose those methods that use little or no fat. They include: Poaching.  Braising.  Steaming.  Grilling.  Baking.  Stir-frying.  Broiling.  Microwaving.  Foods can be cooked in a nonstick pan without added fat, or use a nonfat cooking spray in regular cookware.  Limit fried foods and avoid frying in saturated fat. Add moisture to lean meats by using water, broth, cooking wines, and other nonfat or low-fat sauces along with the cooking methods mentioned above. Soups and stews should be chilled after cooking. The fat that forms on top after a few hours in the refrigerator should be skimmed off. When preparing meals, avoid using excess salt. Salt can contribute to raising blood pressure in some people. EATING AWAY FROM HOME Order entres, potatoes, and vegetables without sauces or butter. When meat exceeds the size of a deck of cards (3 to 4 ounces), the rest can be taken home for another meal. Choose vegetable or fruit salads and ask for low-calorie salad dressings to be served on the side. Use dressings sparingly. Limit high-fat toppings, such as bacon, crumbled eggs, cheese, sunflower seeds, and olives. Ask for heart-healthy tub margarine instead of butter.  Hemorrhoids Hemorrhoids are dilated (enlarged) veins around the rectum. Sometimes clots will form in the veins. This makes them swollen and painful. These are called thrombosed hemorrhoids. Causes of hemorrhoids include:  Constipation.   Straining to have a bowel movement.   HEAVY LIFTING HOME CARE INSTRUCTIONS  Eat a well balanced diet and drink 6 to 8 glasses of water every day to avoid constipation. You may also use a bulk laxative.   Avoid straining to have bowel movements.   Keep anal area dry and clean.   Do not use a donut shaped pillow or sit on the toilet for long periods. This increases  blood pooling and pain.   Move your bowels when your body has the urge; this will require less straining and will decrease pain and pressure.

## 2017-01-08 NOTE — Anesthesia Procedure Notes (Signed)
Procedure Name: MAC Date/Time: 01/08/2017 8:38 AM Performed by: Vista Deck Pre-anesthesia Checklist: Patient identified, Emergency Drugs available, Suction available, Timeout performed and Patient being monitored Patient Re-evaluated:Patient Re-evaluated prior to inductionOxygen Delivery Method: Non-rebreather mask

## 2017-01-08 NOTE — H&P (Signed)
Primary Care Physician:  Abran Richard, MD Primary Gastroenterologist:  Dr. Oneida Alar  Pre-Procedure History & Physical: HPI:  Christine Foley is a 56 y.o. female here for anemia.  Past Medical History:  Diagnosis Date  . Anemia   . Arthritis   . Asthma   . GERD (gastroesophageal reflux disease)   . Hyperlipidemia   . Hypertension   . Hypothyroidism   . 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 Sleep apnea    did not tolerate machine  . Thyroid disease     Past Surgical History:  Procedure Laterality Date  . carpel tunnel     right  . CESAREAN SECTION     x 2  . COLONOSCOPY WITH ESOPHAGOGASTRODUODENOSCOPY (EGD)  2008   Dr. Oneida Alar: Sessile polypoid appearing lesion in the midesophagus benign biopsy, normal appearing gastric mucosa, biopsy with chronic gastritis with H pylori. Small bowel looked normal, biopsy negative for celiac. Terminal ileum was normal, colon normal. next TCS 2018.   . iron infusion      . TUBAL LIGATION      Prior to Admission medications   Medication Sig Start Date End Date Taking? Authorizing Provider  acetaminophen (TYLENOL) 500 MG tablet Take 1,000 mg by mouth every 6 (six) hours as needed (for pain/headache).   Yes Historical Provider, MD  benazepril (LOTENSIN) 20 MG tablet Take 20 mg by mouth daily. 03/13/15  Yes Historical Provider, MD  Calcium Carb-Cholecalciferol (CALCIUM/VITAMIN D PO) Take 1 tablet by mouth 2 (two) times daily. 1200-800   Yes Historical Provider, MD  cetirizine (ZYRTEC) 10 MG tablet Take 10 mg by mouth at bedtime.    Yes Historical Provider, MD  cholecalciferol (VITAMIN D) 1000 units tablet Take 1,000 Units by mouth daily.   Yes Historical Provider, MD  fluticasone (FLONASE) 50 MCG/ACT nasal spray Place 1-2 sprays into both nostrils daily as needed (for allergies).  02/04/15  Yes Historical Provider, MD  hydrochlorothiazide (HYDRODIURIL) 25 MG tablet Take 25 mg by mouth daily. 01/10/15  Yes Historical Provider, MD  levothyroxine  (SYNTHROID, LEVOTHROID) 75 MCG tablet Take 75 mcg by mouth daily before breakfast.  01/06/15  Yes Historical Provider, MD  medroxyPROGESTERone (PROVERA) 5 MG tablet TAKE ONE TABLET (5 MG TOTAL) BY MOUTH DAILY FOR 2 WEEKS - REPEAT EVERY 3 MONTHS AND RECORD MENSTRUAL PERIOD 08/04/16  Yes Jonnie Kind, MD  polyethylene glycol-electrolytes (TRILYTE) 420 g solution Take 4,000 mLs by mouth as directed. 12/24/16  Yes Danie Binder, MD  Potassium Chloride ER 20 MEQ TBCR 1 PO BID FOR 4 DAYS 01/02/17  Yes Danie Binder, MD  pravastatin (PRAVACHOL) 20 MG tablet Take 20 mg by mouth at bedtime.  01/06/15  Yes Historical Provider, MD  ranitidine (ZANTAC) 150 MG capsule Take 150 mg by mouth daily before supper.    Yes Historical Provider, MD    Allergies as of 12/24/2016 - Review Complete 12/24/2016  Allergen Reaction Noted  . Penicillins Shortness Of Breath 03/21/2015    Family History  Problem Relation Age of Onset  . Diabetes Mother   . Hypertension Father   . Breast cancer Maternal Grandmother   . Colon cancer Neg Hx     Social History   Social History  . Marital status: Married    Spouse name: N/A  . Number of children: N/A  . Years of education: N/A   Occupational History  . Not on file.   Social History Main Topics  . Smoking status: Never Smoker  . Smokeless tobacco:  Never Used  . Alcohol use No  . Drug use: No  . Sexual activity: Not Currently    Birth control/ protection: Surgical   Other Topics Concern  . Not on file   Social History Narrative  . No narrative on file    Review of Systems: See HPI, otherwise negative ROS   Physical Exam: BP (!) 151/72   Pulse (!) 104   Temp 97.9 F (36.6 C) (Oral)   Resp 16   Ht 4\' 11"  (1.499 m)   Wt 264 lb (119.7 kg)   LMP 12/27/2016 (Approximate)   SpO2 97%   BMI 53.32 kg/m  General:   Alert,  pleasant and cooperative in NAD Head:  Normocephalic and atraumatic. Neck:  Supple; Lungs:  Clear throughout to auscultation.     Heart:  Regular rate and rhythm. Abdomen:  Soft, nontender and nondistended. Normal bowel sounds, without guarding, and without rebound.   Neurologic:  Alert and  oriented x4;  grossly normal neurologically.  Impression/Plan:     Anemia  PLAN: EGD/TCS TODAY. DISCUSSED PROCEDURE, BENEFITS, & RISKS: < 1% chance of medication reaction, bleeding, perforation, or rupture of spleen/liver.

## 2017-01-08 NOTE — Anesthesia Preprocedure Evaluation (Signed)
Anesthesia Evaluation  Patient identified by MRN, date of birth, ID band Patient awake    Reviewed: Allergy & Precautions, NPO status , Patient's Chart, lab work & pertinent test results  Airway Mallampati: III  TM Distance: >3 FB     Dental  (+) Teeth Intact   Pulmonary asthma , sleep apnea ,    breath sounds clear to auscultation       Cardiovascular hypertension, Pt. on medications  Rhythm:Regular Rate:Normal     Neuro/Psych    GI/Hepatic GERD  ,  Endo/Other  Hypothyroidism   Renal/GU      Musculoskeletal   Abdominal   Peds  Hematology  (+) anemia ,   Anesthesia Other Findings   Reproductive/Obstetrics                             Anesthesia Physical Anesthesia Plan  ASA: II  Anesthesia Plan: MAC   Post-op Pain Management:    Induction: Intravenous  Airway Management Planned: Simple Face Mask  Additional Equipment:   Intra-op Plan:   Post-operative Plan:   Informed Consent: I have reviewed the patients History and Physical, chart, labs and discussed the procedure including the risks, benefits and alternatives for the proposed anesthesia with the patient or authorized representative who has indicated his/her understanding and acceptance.     Plan Discussed with:   Anesthesia Plan Comments:         Anesthesia Quick Evaluation

## 2017-01-11 ENCOUNTER — Encounter (HOSPITAL_COMMUNITY): Payer: Self-pay | Admitting: Gastroenterology

## 2017-01-22 ENCOUNTER — Other Ambulatory Visit: Payer: Self-pay

## 2017-01-22 DIAGNOSIS — D509 Iron deficiency anemia, unspecified: Secondary | ICD-10-CM

## 2017-01-22 NOTE — Progress Notes (Signed)
Patient on recall for 3 yr tcs

## 2017-01-31 ENCOUNTER — Encounter (HOSPITAL_COMMUNITY)
Admission: RE | Disposition: A | Discharge status: Home / Self Care | Payer: Self-pay | Source: Ambulatory Visit | Attending: Gastroenterology

## 2017-01-31 ENCOUNTER — Ambulatory Visit (HOSPITAL_COMMUNITY)
Admission: RE | Admit: 2017-01-31 | Discharge: 2017-01-31 | Disposition: A | Discharge status: Home / Self Care | Payer: BLUE CROSS/BLUE SHIELD | Source: Ambulatory Visit | Attending: Gastroenterology | Admitting: Gastroenterology

## 2017-01-31 DIAGNOSIS — D509 Iron deficiency anemia, unspecified: Secondary | ICD-10-CM | POA: Diagnosis not present

## 2017-01-31 DIAGNOSIS — D500 Iron deficiency anemia secondary to blood loss (chronic): Secondary | ICD-10-CM | POA: Insufficient documentation

## 2017-01-31 HISTORY — PX: GIVENS CAPSULE STUDY: SHX5432

## 2017-01-31 SURGERY — IMAGING PROCEDURE, GI TRACT, INTRALUMINAL, VIA CAPSULE

## 2017-01-31 MED ORDER — SIMETHICONE 40 MG/0.6ML PO SUSP
ORAL | Status: AC
  Filled 2017-01-31: qty 0.6

## 2017-02-02 NOTE — Procedures (Addendum)
2016-2018 FERRITIN 26-->7, Hb 12.5-14.7 MCV 78.3-81.3   PATIENT DATA: GASTRIC PASSAGE TIME: 31 m, SB PASSAGE TIME: 3H 17m  RESULTS: LIMITED views of gastric mucosa due to retained contents.  No ULCERS, masses or AVMs seen IN THE SMALL BOWL.  LIMITED VIEWS OF THE COLON DUE TO RETAINED CONTENTS. No old blood or fresh blood in the stomach, small bowel, or colon.  DIAGNOSIS: NORMAL GIVENS STUDY  Plan: 1. CBC/FERRITIN 1 WEEK PRIOR TO NEXT OPV 2. OPV IN JUL 2018 E30 ANEMIA

## 2017-02-04 ENCOUNTER — Encounter (HOSPITAL_COMMUNITY): Payer: Self-pay | Admitting: Gastroenterology

## 2017-03-28 NOTE — Progress Notes (Signed)
REVIEWED-NO ADDITIONAL RECOMMENDATIONS. 

## 2017-04-04 ENCOUNTER — Telehealth: Payer: Self-pay | Admitting: Gastroenterology

## 2017-04-04 NOTE — Telephone Encounter (Signed)
RECALL FOR TCS °

## 2017-04-04 NOTE — Telephone Encounter (Signed)
Letter mailed

## 2017-04-10 ENCOUNTER — Ambulatory Visit (HOSPITAL_COMMUNITY): Payer: BLUE CROSS/BLUE SHIELD | Admitting: Adult Health

## 2017-04-10 ENCOUNTER — Encounter (HOSPITAL_COMMUNITY): Payer: BLUE CROSS/BLUE SHIELD | Attending: Oncology

## 2017-04-10 ENCOUNTER — Encounter (HOSPITAL_COMMUNITY): Payer: Self-pay | Admitting: Adult Health

## 2017-04-10 ENCOUNTER — Encounter (HOSPITAL_BASED_OUTPATIENT_CLINIC_OR_DEPARTMENT_OTHER): Payer: BLUE CROSS/BLUE SHIELD | Admitting: Adult Health

## 2017-04-10 VITALS — BP 142/80 | HR 88 | Temp 98.1°F | Resp 18 | Ht 59.0 in | Wt 265.5 lb

## 2017-04-10 DIAGNOSIS — E611 Iron deficiency: Secondary | ICD-10-CM | POA: Insufficient documentation

## 2017-04-10 DIAGNOSIS — D751 Secondary polycythemia: Secondary | ICD-10-CM

## 2017-04-10 DIAGNOSIS — N920 Excessive and frequent menstruation with regular cycle: Secondary | ICD-10-CM

## 2017-04-10 DIAGNOSIS — D509 Iron deficiency anemia, unspecified: Secondary | ICD-10-CM

## 2017-04-10 LAB — CBC WITH DIFFERENTIAL/PLATELET
BASOS PCT: 0 %
Basophils Absolute: 0 10*3/uL (ref 0.0–0.1)
EOS ABS: 0.4 10*3/uL (ref 0.0–0.7)
Eosinophils Relative: 5 %
HEMATOCRIT: 45 % (ref 36.0–46.0)
Hemoglobin: 15.1 g/dL — ABNORMAL HIGH (ref 12.0–15.0)
Lymphocytes Relative: 36 %
Lymphs Abs: 2.9 10*3/uL (ref 0.7–4.0)
MCH: 27.9 pg (ref 26.0–34.0)
MCHC: 33.6 g/dL (ref 30.0–36.0)
MCV: 83.2 fL (ref 78.0–100.0)
MONO ABS: 0.3 10*3/uL (ref 0.1–1.0)
MONOS PCT: 4 %
NEUTROS ABS: 4.4 10*3/uL (ref 1.7–7.7)
Neutrophils Relative %: 55 %
Platelets: 289 10*3/uL (ref 150–400)
RBC: 5.41 MIL/uL — ABNORMAL HIGH (ref 3.87–5.11)
RDW: 13.7 % (ref 11.5–15.5)
WBC: 7.9 10*3/uL (ref 4.0–10.5)

## 2017-04-10 LAB — FERRITIN: Ferritin: 50 ng/mL (ref 11–307)

## 2017-04-10 NOTE — Progress Notes (Signed)
Christine Foley, [place] [id number]   CLINIC:  Medical Oncology/Hematology  PCP:  Abran Richard, MD [address] [place]  [address] [phone]   REASON FOR VISIT:  Follow-up for Iron deficiency anemia AND secondary polycythemia secondary to sleep apnea   CURRENT THERAPY: IV iron prn     INTERVAL HISTORY:  Christine Foley 56 y.o. female returns for routine follow-up for iron deficiency anemia and secondary polycythemia.   Overall, she tells me she has been feeling quite well. Her appetite is 100%; energy levels 75%. Denies pain.  Since her last visit in the cancer center in 10/2016, she underwent GI evaluation with Dr. Oneida Alar. She had a colonoscopy, EGD, and video capsule study. She is unsure of the results of all of this testing.  Denies any blood in her stools, dark/tarry stools, hematuria, nosebleeds, or gingival bleeding. She does continue to have very heavy menstrual periods. She is followed by her OB/GYN, Dr. Glo Herring; she takes Provera intermittently. He states that when she has heavy vaginal bleeding, she bleeds through 3 pads very quickly. Denies any dizziness, headaches, chest pain, or other related concerns. Denies any easy bruising.  She has undergone sleep study in the past and was diagnosed with sleep apnea. She tells me she has been unable to wear a few different CPAP masks. She states that she sleeps well without the CPAP, and awakes the next day feeling well rested. Denies requiring any naps or significant fatigue during the daytime.  Otherwise, she is largely without complaints today.   REVIEW OF SYSTEMS:  Review of Systems  Constitutional: Positive for fatigue (Mild fatigue). Negative for chills and fever.  HENT:  Negative.  Negative for lump/mass and nosebleeds.   Eyes: Negative.   Respiratory: Negative.  Negative for cough and shortness of breath.   Cardiovascular: Negative.  Negative for chest pain and leg swelling.    Gastrointestinal: Negative.  Negative for abdominal pain, blood in stool, constipation, diarrhea, nausea and vomiting.  Endocrine: Negative.   Genitourinary: Positive for vaginal bleeding. Negative for dysuria and hematuria.   Musculoskeletal: Negative.  Negative for arthralgias.  Skin: Negative.  Negative for rash.  Neurological: Negative.  Negative for dizziness and headaches.  Hematological: Negative.  Negative for adenopathy. Does not bruise/bleed easily.  Psychiatric/Behavioral: Negative.  Negative for depression and sleep disturbance. The patient is not nervous/anxious.      PAST MEDICAL/SURGICAL HISTORY:  Past Medical History:  Diagnosis Date  . Anemia   . Arthritis   . Asthma   . GERD (gastroesophageal reflux disease)   . Hyperlipidemia   . Hypertension   . Hypothyroidism   . 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 Sleep apnea    did not tolerate machine  . Thyroid disease    Past Surgical History:  Procedure Laterality Date  . BIOPSY  01/08/2017   Procedure: BIOPSY;  Surgeon: Danie Binder, MD;  [facility];  Service: Endoscopy;;  cecal polyps x2, ascending colon polyp, duodenal bx's , gastric bx's , gastric polyps bx  . carpel tunnel     right  . CESAREAN SECTION     x 2  . COLONOSCOPY WITH ESOPHAGOGASTRODUODENOSCOPY (EGD)  2008   Dr. Oneida Alar: Sessile polypoid appearing lesion in the midesophagus benign biopsy, normal appearing gastric mucosa, biopsy with chronic gastritis with H pylori. Small bowel looked normal, biopsy negative for celiac. Terminal ileum was normal, colon normal. next TCS 2018.   Marland Kitchen COLONOSCOPY WITH PROPOFOL  N/A 01/08/2017   Procedure: COLONOSCOPY WITH PROPOFOL;  Surgeon: Danie Binder, MD;  [facility];  Service: Endoscopy;  Laterality: N/A;  8:45AM  . ESOPHAGOGASTRODUODENOSCOPY (EGD) WITH PROPOFOL N/A 01/08/2017   Procedure: ESOPHAGOGASTRODUODENOSCOPY (EGD) WITH PROPOFOL;  Surgeon: Danie Binder, MD;  [facility];   Service: Endoscopy;  Laterality: N/A;  . GIVENS CAPSULE STUDY N/A 01/31/2017   Procedure: GIVENS CAPSULE STUDY;  Surgeon: Danie Binder, MD;  [facility];  Service: Endoscopy;  Laterality: N/A;  7:30am, patient to arrive at 7:00am  . iron infusion      . POLYPECTOMY  01/08/2017   Procedure: POLYPECTOMY;  Surgeon: Danie Binder, MD;  [facility];  Service: Endoscopy;;  distal transverse colon polyp  . TUBAL LIGATION       SOCIAL HISTORY:  Social History   Social History  . Marital status: Married    Spouse name: N/A  . Number of children: N/A  . Years of education: N/A   Occupational History  . Not on file.   Social History Main Topics  . Smoking status: Never Smoker  . Smokeless tobacco: Never Used  . Alcohol use No  . Drug use: No  . Sexual activity: Not Currently    Birth control/ protection: Surgical   Other Topics Concern  . Not on file   Social History Narrative  . No narrative on file    FAMILY HISTORY:  Family History  Problem Relation Age of Onset  . Diabetes Mother   . Hypertension Father   . Breast cancer Maternal Grandmother   . Colon cancer Neg Hx     CURRENT MEDICATIONS:  Outpatient Encounter Prescriptions as of 04/10/2017  Medication Sig Note  . acetaminophen (TYLENOL) 500 MG tablet Take 1,000 mg by mouth every 6 (six) hours as needed (for pain/headache).   . benazepril (LOTENSIN) 20 MG tablet Take 20 mg by mouth daily.   . Calcium Carb-Cholecalciferol (CALCIUM/VITAMIN D PO) Take 1 tablet by mouth 2 (two) times daily. 1200-800   . cetirizine (ZYRTEC) 10 MG tablet Take 10 mg by mouth at bedtime.    . cholecalciferol (VITAMIN D) 1000 units tablet Take 1,000 Units by mouth daily.   . fluticasone (FLONASE) 50 MCG/ACT nasal spray Place 1-2 sprays into both nostrils daily as needed (for allergies).    . hydrochlorothiazide (HYDRODIURIL) 25 MG tablet Take 25 mg by mouth daily.   Marland Kitchen ibuprofen (ADVIL,MOTRIN) 800 MG tablet    .  levothyroxine (SYNTHROID, LEVOTHROID) 75 MCG tablet Take 75 mcg by mouth daily before breakfast.    . medroxyPROGESTERone (PROVERA) 5 MG tablet TAKE ONE TABLET (5 MG TOTAL) BY MOUTH DAILY FOR 2 WEEKS - REPEAT EVERY 3 MONTHS AND RECORD MENSTRUAL PERIOD 12/27/2016: On hold as on 12/28/16 for 3 months  . Potassium Chloride ER 20 MEQ TBCR 1 PO BID FOR 4 DAYS   . pravastatin (PRAVACHOL) 20 MG tablet Take 20 mg by mouth at bedtime.    . ranitidine (ZANTAC) 150 MG capsule Take 150 mg by mouth daily before supper.     No facility-administered encounter medications on file as of 04/10/2017.     ALLERGIES:  Allergies  Allergen Reactions  . Penicillins Hives, Shortness Of Breath and Rash    Has patient had a PCN reaction causing immediate rash, facial/tongue/throat swelling, SOB or lightheadedness with hypotension:Yes Has patient had a PCN reaction causing severe rash involving mucus membranes or skin necrosis:unsure Has patient had a  PCN reaction that required hospitalization:Treated in ER Has patient had a PCN reaction occurring within the last 10 years:unsure If all of the above answers are "NO", then may proceed with Cephalosporin use.      PHYSICAL EXAM:  ECOG Performance status: 0-1 - Largely asymptomatic   Vitals:   04/10/17 1323  BP: (!) 142/80  Pulse: 88  Resp: 18  Temp: 98.1 F (36.7 C)   Filed Weights   04/10/17 1323  Weight: 265 lb 8 oz (120.4 kg)    Physical Exam  Constitutional: She is oriented to person, place, and time and well-developed, well-nourished, and in no distress.  HENT:  Head: Normocephalic.  Mouth/Throat: Oropharynx is clear and moist. No oropharyngeal exudate.  Eyes: Conjunctivae are normal. Pupils are equal, round, and reactive to light. No scleral icterus.  Neck: Normal range of motion. Neck supple.  Cardiovascular: Normal rate and regular rhythm.   Pulmonary/Chest: Effort normal and breath sounds normal. No respiratory distress. She has no rales.    Abdominal: Soft. Bowel sounds are normal. There is no tenderness. There is no rebound and no guarding.  Musculoskeletal: Normal range of motion. She exhibits no edema.  Lymphadenopathy:    She has no cervical adenopathy.       Right: No supraclavicular adenopathy present.       Left: No supraclavicular adenopathy present.  Neurological: She is alert and oriented to person, place, and time. No cranial nerve deficit. Gait normal.  Skin: Skin is warm and dry. No rash noted.  Psychiatric: Mood, memory, affect and judgment normal.  Nursing note and vitals reviewed.    LABORATORY DATA:  I have reviewed the labs as listed.  CBC    Component Value Date/Time   WBC 7.9 04/10/2017 1240   RBC 5.41 (H) 04/10/2017 1240   HGB 15.1 (H) 04/10/2017 1240   HCT 45.0 04/10/2017 1240   PLT 289 04/10/2017 1240   MCV 83.2 04/10/2017 1240   MCH 27.9 04/10/2017 1240   MCHC 33.6 04/10/2017 1240   RDW 13.7 04/10/2017 1240   LYMPHSABS 2.9 04/10/2017 1240   MONOABS 0.3 04/10/2017 1240   EOSABS 0.4 04/10/2017 1240   BASOSABS 0.0 04/10/2017 1240   CMP Latest Ref Rng & Units 01/02/2017 02/09/2016 03/21/2015  Glucose 65 - 99 mg/dL 102(H) 88 106(H)  BUN 6 - 20 mg/dL 14 14 13   Creatinine 0.44 - 1.00 mg/dL 0.61 0.76 0.70  Sodium 135 - 145 mmol/L 135 140 138  Potassium 3.5 - 5.1 mmol/L 3.3(L) 4.0 3.0(L)  Chloride 101 - 111 mmol/L 100(L) 103 99(L)  CO2 22 - 32 mmol/L 28 29 28   Calcium 8.9 - 10.3 mg/dL 9.0 9.0 9.2  Total Protein 6.5 - 8.1 g/dL - 6.9 7.4  Total Bilirubin 0.3 - 1.2 mg/dL - 1.0 1.1  Alkaline Phos 38 - 126 U/L - 57 64  AST 15 - 41 U/L - 19 25  ALT 14 - 54 U/L - 18 21    PENDING LABS:    DIAGNOSTIC IMAGING:  Colonoscopy/EGD: 01/08/17      Video capsule study: 01/31/17       PATHOLOGY:        ASSESSMENT & PLAN:   Iron deficiency anemia:  -Likely secondary to chronic blood loss from menorrhagia. Underwent complete GI evaluation with colonoscopy, EGD, and video capsule study  which were all negative for bleeding/etiology of iron deficiency anemia. She stated that she never received the results of these procedures; we reviewed them in detail today. GI  blood loss not likely contributing to iron deficiency.  -Continues to struggle with significant vaginal bleeding.  She remains on intermittent Provera as prescribed by her OB-GYN, Dr. Glo Herring. We discussed today that her iron deficiency may improve if her vaginal bleeding improves with menopause. States that her bleeding is intermittent, but very heavy soaking through 3 pads at a time.  She very well may benefit from hysterectomy, if deemed appropriate by Dr. Glo Herring. Continue follow-up with gynecology, as directed. -Ferritin pending for today. Last IV iron infusion was given on 12/06/16. We will certainly call the patient when her ferritin results are available and make arrangements for additional doses of IV iron as clinically indicated.   Oncology Flowsheet 12/06/2016  ferric carboxymaltose (INJECTAFER) IV 750 mg    Secondary polycythemia: -Likely d/t sleep apnea. She has been unable to tolerate CPAP mask/machine in the past; as a result does not use CPAP very often.  -Reinforced the importance of managing/controlling sleep apnea and how it relates to her blood counts. Encouraged her to follow-up with PCP regarding possible other options to manage sleep apnea more effectively.  -Hematocrit normal today. Mild erythrocytosis with hemoglobin 15.1 g/dL today.        Dispo:  -Return to cancer center in 6 months with labs.    All questions were answered to patient's stated satisfaction. Encouraged patient to call with any new concerns or questions before her next visit to the cancer center and we can certain see her sooner, if needed.    Plan of care discussed with Dr. Talbert Cage, who agrees with the above aforementioned.    Orders placed this encounter:  Orders Placed This Encounter  Procedures  . CBC with  Differential/Platelet  . Ferritin  . Iron and TIBC      Mike Craze, NP [place] [phone]

## 2017-04-10 NOTE — Patient Instructions (Signed)
[place] at [facility] Discharge Instructions  RECOMMENDATIONS MADE BY THE CONSULTANT AND ANY TEST RESULTS WILL BE SENT TO YOUR REFERRING PHYSICIAN.  [place] at [facility] Discharge Instructions  RECOMMENDATIONS MADE BY THE CONSULTANT AND ANY TEST RESULTS WILL BE SENT TO YOUR REFERRING PHYSICIAN.  You were seen today by Mike Craze NP.   Thank you for choosing [place] at [facility] to provide your oncology and hematology care.  To afford each patient quality time with our provider, please arrive at least 15 minutes before your scheduled appointment time.    If you have a lab appointment with the [facility] please come in thru the  Main Entrance and check in at the main information desk  You need to re-schedule your appointment should you arrive 10 or more minutes late.  We strive to give you quality time with our providers, and arriving late affects you and other patients whose appointments are after yours.  Also, if you no show three or more times for appointments you may be dismissed from the clinic at the providers discretion.     Again, thank you for choosing [facility].  Our hope is that these requests will decrease the amount of time that you wait before being seen by our physicians.       _____________________________________________________________  Should you have questions after your visit to [facility], please contact our office at [phone] between the hours of 8:30 a.m. and 4:30 p.m.  Voicemails left after 4:30 p.m. will not be returned until the following business day.  For prescription refill requests, have your pharmacy contact our office.       Resources For Cancer Patients and their Caregivers ? American Cancer Society: Can assist with transportation, wigs, general needs, runs Look Good Feel Better.        941 187 9954 ? Cancer Care: Provides  financial assistance, online support groups, medication/co-pay assistance.  1-800-813-HOPE (504) 409-5450) ? [place] Assists [place] cancer patients and their families through emotional , educational and financial support.  910-475-7078 ? [place] DSS Where to apply for food stamps, Medicaid and utility assistance. (734)310-9631 ? [facility]: Transportation to medical appointments. (351) 536-1067 ? Social Security Administration: May apply for disability if have a Stage IV cancer. 346-748-9629 (848)418-1089 ? [employer], Disability and Transit Services: Assists with nutrition, care and transit needs. [place] Support Programs: @10RELATIVEDAYS @ > Cancer Support Group  2nd Tuesday of the month 1pm-2pm, Journey Room  > Creative Journey  3rd Tuesday of the month 1130am-1pm, Journey Room  > Look Good Feel Better  1st Wednesday of the month 10am-12 noon, Journey Room (Call [place] to register [phone])     Thank you for choosing [place] at [facility] to provide your oncology and hematology care.  To afford each patient quality time with our provider, please arrive at least 15 minutes before your scheduled appointment time.    If you have a lab appointment with the [facility] please come in thru the  Main Entrance and check in at the main information desk  You need to re-schedule your appointment should you arrive 10 or more minutes late.  We strive to give you quality time with our providers, and arriving late affects you and other patients whose appointments are after yours.  Also, if you no show three or more times  for appointments you may be dismissed from the clinic at the providers discretion.     Again, thank you for choosing [facility].  Our hope is that these requests will decrease the amount of time that you wait before being seen by our physicians.        _____________________________________________________________  Should you have questions after your visit to [facility], please contact our office at [phone] between the hours of 8:30 a.m. and 4:30 p.m.  Voicemails left after 4:30 p.m. will not be returned until the following business day.  For prescription refill requests, have your pharmacy contact our office.       Resources For Cancer Patients and their Caregivers ? American Cancer Society: Can assist with transportation, wigs, general needs, runs Look Good Feel Better.        (628)777-4554 ? Cancer Care: Provides financial assistance, online support groups, medication/co-pay assistance.  1-800-813-HOPE 512 595 7323) ? [place] Assists [place] cancer patients and their families through emotional , educational and financial support.  (734)549-9582 ? [place] DSS Where to apply for food stamps, Medicaid and utility assistance. 8627242140 ? RCATS: Transportation to medical appointments. 939-118-5709 ? Social Security Administration: May apply for disability if have a Stage IV cancer. 336-404-0094 612-039-4055 ? [employer], Disability and Transit Services: Assists with nutrition, care and transit needs. [place] Support Programs: @10RELATIVEDAYS @ > Cancer Support Group  2nd Tuesday of the month 1pm-2pm, Journey Room  > Creative Journey  3rd Tuesday of the month 1130am-1pm, Journey Room  > Look Good Feel Better  1st Wednesday of the month 10am-12 noon, Journey Room (Call [place] to register [phone])

## 2017-04-16 ENCOUNTER — Telehealth (HOSPITAL_COMMUNITY): Payer: Self-pay | Admitting: Oncology

## 2017-04-16 NOTE — Telephone Encounter (Signed)
$  0 bal for injectafer

## 2017-10-03 ENCOUNTER — Encounter: Payer: Self-pay | Admitting: Obstetrics and Gynecology

## 2017-10-03 ENCOUNTER — Other Ambulatory Visit: Payer: Self-pay

## 2017-10-03 ENCOUNTER — Ambulatory Visit (INDEPENDENT_AMBULATORY_CARE_PROVIDER_SITE_OTHER): Payer: BLUE CROSS/BLUE SHIELD | Admitting: Obstetrics and Gynecology

## 2017-10-03 VITALS — BP 126/76 | HR 92 | Ht 59.0 in | Wt 264.0 lb

## 2017-10-03 DIAGNOSIS — Z01411 Encounter for gynecological examination (general) (routine) with abnormal findings: Secondary | ICD-10-CM

## 2017-10-03 DIAGNOSIS — N950 Postmenopausal bleeding: Secondary | ICD-10-CM

## 2017-10-03 DIAGNOSIS — N841 Polyp of cervix uteri: Secondary | ICD-10-CM | POA: Diagnosis not present

## 2017-10-03 DIAGNOSIS — Z01419 Encounter for gynecological examination (general) (routine) without abnormal findings: Secondary | ICD-10-CM

## 2017-10-03 NOTE — Progress Notes (Addendum)
[facility] Visit  10/03/2017            Patient name: Christine Foley MRN [id number]  Date of birth: September 16, 1961  CC & HPI:  Christine Foley is a 56 y.o. female presenting today for slight spotting that occurs 2 weeks after she takes Provera. The patient has no complaints other than her menorrhagia. Pt has been on Provera every 3 months, since 2016 to control her menorrhagia. She has occasional spotting for about 5-10 days two weeks after she stops taking Provera. Her last mammogram was 09/2016. No alleviating factors noted. Pt has not tried any other medications for relief.   Hx of C section x 2, and tubal ligation.  ROS:  ROS  +slight vaginal spotting -fever All systems are negative except as noted in the HPI and PMH.    Pertinent History Reviewed:   Reviewed: Significant for menorrhagia Medical         Past Medical History:  Diagnosis Date  . Anemia   . Arthritis   . Asthma   . GERD (gastroesophageal reflux disease)   . Hyperlipidemia   . Hypertension   . Hypothyroidism   . 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 Sleep apnea    did not tolerate machine  . Thyroid disease                               Surgical Hx:    Past Surgical History:  Procedure Laterality Date  . BIOPSY  01/08/2017   Procedure: BIOPSY;  Surgeon: Danie Binder, MD;  [facility];  Service: Endoscopy;;  cecal polyps x2, ascending colon polyp, duodenal bx's , gastric bx's , gastric polyps bx  . carpel tunnel     right  . CESAREAN SECTION     x 2  . COLONOSCOPY WITH ESOPHAGOGASTRODUODENOSCOPY (EGD)  2008   Dr. Oneida Alar: Sessile polypoid appearing lesion in the midesophagus benign biopsy, normal appearing gastric mucosa, biopsy with chronic gastritis with H pylori. Small bowel looked normal, biopsy negative for celiac. Terminal ileum was normal, colon normal. next TCS 2018.   Marland Kitchen COLONOSCOPY WITH PROPOFOL N/A 01/08/2017   Procedure: COLONOSCOPY WITH PROPOFOL;  Surgeon: Danie Binder,  MD;  [facility];  Service: Endoscopy;  Laterality: N/A;  8:45AM  . ESOPHAGOGASTRODUODENOSCOPY (EGD) WITH PROPOFOL N/A 01/08/2017   Procedure: ESOPHAGOGASTRODUODENOSCOPY (EGD) WITH PROPOFOL;  Surgeon: Danie Binder, MD;  [facility];  Service: Endoscopy;  Laterality: N/A;  . GIVENS CAPSULE STUDY N/A 01/31/2017   Procedure: GIVENS CAPSULE STUDY;  Surgeon: Danie Binder, MD;  [facility];  Service: Endoscopy;  Laterality: N/A;  7:30am, patient to arrive at 7:00am  . iron infusion      . POLYPECTOMY  01/08/2017   Procedure: POLYPECTOMY;  Surgeon: Danie Binder, MD;  [facility];  Service: Endoscopy;;  distal transverse colon polyp  . TUBAL LIGATION     Medications: Reviewed & Updated - see associated section                       Current Outpatient Medications:  .  acetaminophen (TYLENOL) 500 MG tablet, Take 1,000 mg by mouth every 6 (six) hours as needed (for pain/headache)., Disp: , Rfl:  .  benazepril (LOTENSIN) 20 MG tablet, Take 20 mg by mouth daily., Disp: , Rfl: 1 .  Calcium Carb-Cholecalciferol (CALCIUM/VITAMIN D  PO), Take 1 tablet by mouth 2 (two) times daily. 1200-800, Disp: , Rfl:  .  cetirizine (ZYRTEC) 10 MG tablet, Take 10 mg by mouth at bedtime. , Disp: , Rfl:  .  cholecalciferol (VITAMIN D) 1000 units tablet, Take 1,000 Units by mouth daily., Disp: , Rfl:  .  fluticasone (FLONASE) 50 MCG/ACT nasal spray, Place 1-2 sprays into both nostrils daily as needed (for allergies). , Disp: , Rfl: 0 .  hydrochlorothiazide (HYDRODIURIL) 25 MG tablet, Take 25 mg by mouth daily., Disp: , Rfl: 0 .  ibuprofen (ADVIL,MOTRIN) 800 MG tablet, , Disp: , Rfl: 0 .  levothyroxine (SYNTHROID, LEVOTHROID) 75 MCG tablet, Take 75 mcg by mouth daily before breakfast. , Disp: , Rfl: 0 .  medroxyPROGESTERone (PROVERA) 5 MG tablet, TAKE ONE TABLET (5 MG TOTAL) BY MOUTH DAILY FOR 2 WEEKS - REPEAT EVERY 3 MONTHS AND RECORD MENSTRUAL PERIOD, Disp: 14 tablet, Rfl: 5 .   pravastatin (PRAVACHOL) 20 MG tablet, Take 20 mg by mouth at bedtime. , Disp: , Rfl: 0 .  ranitidine (ZANTAC) 150 MG capsule, Take 150 mg by mouth daily before supper. , Disp: , Rfl:  .  Potassium Chloride ER 20 MEQ TBCR, 1 PO BID FOR 4 DAYS (Patient not taking: Reported on 10/03/2017), Disp: 8 tablet, Rfl: 0   Social History: Reviewed -  reports that  has never smoked. she has never used smokeless tobacco.  Objective Findings:  Vitals: Blood pressure 126/76, pulse 92, height 4\' 11"  (1.499 m), weight 264 lb (119.7 kg), last menstrual period 06/09/2017.  Physical Examination: General appearance - alert, well appearing, and in no distress Mental status - alert, oriented to person, place, and time Breasts - breasts appear normal, no suspicious masses, no skin or nipple changes or axillary nodes Pelvic -  VULVA: normal appearing vulva with no masses, tenderness or lesions,  VAGINA: normal appearing vagina with normal color and discharge, no lesions, CERVIX: cervical polyp present, pt gives consent for removal UTERUS: uterus is normal size, shape, consistency and nontender,  ADNEXA: normal adnexa in size, nontender and no masses Biopsy:  Pt consent obtained:   polyp grasped, placed on traction, and narrow stalk identified. This may represent endocervical polyp. Metzenbaum scissors used to trim polyp as high in endocervix as accessible.  Specimen to pathology, noted to have mucus producing area in specimen, likely endocervical origin.  Assessment & Plan:   A:  1. Prolapsed endometrial/endocervical polyp 2.  History of normal endometrial biopsy 2016   P:  1. 2 cm long endometrial polyp trimmed high inside cervix 2.  Return in 2 weeks for Pap, will need u/s Transvag. Return  By signing my name below, I, Christine Foley, attest that this documentation has been prepared under the direction and in the presence of Jonnie Kind, MD. Electronically Signed: Jabier Gauss, Medical Scribe. 10/03/17.  2:34 PM.  I personally performed the services described in this documentation, which was SCRIBED in my presence. The recorded information has been reviewed and considered accurate. It has been edited as necessary during review. Jonnie Kind, MD

## 2017-10-04 ENCOUNTER — Other Ambulatory Visit: Payer: Self-pay | Admitting: Internal Medicine

## 2017-10-04 DIAGNOSIS — Z1231 Encounter for screening mammogram for malignant neoplasm of breast: Secondary | ICD-10-CM

## 2017-10-08 ENCOUNTER — Encounter (HOSPITAL_BASED_OUTPATIENT_CLINIC_OR_DEPARTMENT_OTHER): Payer: BLUE CROSS/BLUE SHIELD | Admitting: Oncology

## 2017-10-08 ENCOUNTER — Other Ambulatory Visit: Payer: Self-pay

## 2017-10-08 ENCOUNTER — Encounter (HOSPITAL_COMMUNITY): Payer: Self-pay | Admitting: Oncology

## 2017-10-08 ENCOUNTER — Encounter (HOSPITAL_COMMUNITY): Payer: BLUE CROSS/BLUE SHIELD | Attending: Oncology

## 2017-10-08 VITALS — BP 120/60 | HR 87 | Temp 98.7°F | Resp 18 | Wt 265.2 lb

## 2017-10-08 DIAGNOSIS — I10 Essential (primary) hypertension: Secondary | ICD-10-CM | POA: Insufficient documentation

## 2017-10-08 DIAGNOSIS — N920 Excessive and frequent menstruation with regular cycle: Secondary | ICD-10-CM

## 2017-10-08 DIAGNOSIS — G4730 Sleep apnea, unspecified: Secondary | ICD-10-CM | POA: Insufficient documentation

## 2017-10-08 DIAGNOSIS — Z79890 Hormone replacement therapy (postmenopausal): Secondary | ICD-10-CM | POA: Diagnosis not present

## 2017-10-08 DIAGNOSIS — Z880 Allergy status to penicillin: Secondary | ICD-10-CM | POA: Diagnosis not present

## 2017-10-08 DIAGNOSIS — Z79899 Other long term (current) drug therapy: Secondary | ICD-10-CM | POA: Insufficient documentation

## 2017-10-08 DIAGNOSIS — J45909 Unspecified asthma, uncomplicated: Secondary | ICD-10-CM | POA: Diagnosis not present

## 2017-10-08 DIAGNOSIS — K219 Gastro-esophageal reflux disease without esophagitis: Secondary | ICD-10-CM | POA: Insufficient documentation

## 2017-10-08 DIAGNOSIS — G8929 Other chronic pain: Secondary | ICD-10-CM | POA: Diagnosis not present

## 2017-10-08 DIAGNOSIS — Z8249 Family history of ischemic heart disease and other diseases of the circulatory system: Secondary | ICD-10-CM | POA: Insufficient documentation

## 2017-10-08 DIAGNOSIS — E611 Iron deficiency: Secondary | ICD-10-CM

## 2017-10-08 DIAGNOSIS — E785 Hyperlipidemia, unspecified: Secondary | ICD-10-CM | POA: Insufficient documentation

## 2017-10-08 DIAGNOSIS — D509 Iron deficiency anemia, unspecified: Secondary | ICD-10-CM | POA: Diagnosis not present

## 2017-10-08 DIAGNOSIS — D508 Other iron deficiency anemias: Secondary | ICD-10-CM | POA: Diagnosis not present

## 2017-10-08 DIAGNOSIS — Z833 Family history of diabetes mellitus: Secondary | ICD-10-CM | POA: Insufficient documentation

## 2017-10-08 DIAGNOSIS — D751 Secondary polycythemia: Secondary | ICD-10-CM

## 2017-10-08 DIAGNOSIS — E039 Hypothyroidism, unspecified: Secondary | ICD-10-CM | POA: Insufficient documentation

## 2017-10-08 DIAGNOSIS — M25561 Pain in right knee: Secondary | ICD-10-CM | POA: Insufficient documentation

## 2017-10-08 LAB — CBC WITH DIFFERENTIAL/PLATELET
Basophils Absolute: 0 10*3/uL (ref 0.0–0.1)
Basophils Relative: 1 %
EOS ABS: 0.4 10*3/uL (ref 0.0–0.7)
EOS PCT: 5 %
HCT: 44.7 % (ref 36.0–46.0)
HEMOGLOBIN: 14.4 g/dL (ref 12.0–15.0)
LYMPHS ABS: 2.8 10*3/uL (ref 0.7–4.0)
Lymphocytes Relative: 33 %
MCH: 27.3 pg (ref 26.0–34.0)
MCHC: 32.2 g/dL (ref 30.0–36.0)
MCV: 84.8 fL (ref 78.0–100.0)
Monocytes Absolute: 0.4 10*3/uL (ref 0.1–1.0)
Monocytes Relative: 4 %
Neutro Abs: 4.9 10*3/uL (ref 1.7–7.7)
Neutrophils Relative %: 57 %
Platelets: 314 10*3/uL (ref 150–400)
RBC: 5.27 MIL/uL — AB (ref 3.87–5.11)
RDW: 13.5 % (ref 11.5–15.5)
WBC: 8.4 10*3/uL (ref 4.0–10.5)

## 2017-10-08 LAB — IRON AND TIBC
Iron: 59 ug/dL (ref 28–170)
Saturation Ratios: 15 % (ref 10.4–31.8)
TIBC: 381 ug/dL (ref 250–450)
UIBC: 322 ug/dL

## 2017-10-08 LAB — FERRITIN: FERRITIN: 26 ng/mL (ref 11–307)

## 2017-10-08 NOTE — Patient Instructions (Signed)
[facility] at [facility] Discharge Instructions  RECOMMENDATIONS MADE BY THE CONSULTANT AND ANY TEST RESULTS WILL BE SENT TO YOUR REFERRING PHYSICIAN.  You were seen today by Dr. Louise Zhou    Thank you for choosing [facility] at [facility] to provide your oncology and hematology care.  To afford each patient quality time with our provider, please arrive at least 15 minutes before your scheduled appointment time.    If you have a lab appointment with the Cancer Center please come in thru the  Main Entrance and check in at the main information desk  You need to re-schedule your appointment should you arrive 10 or more minutes late.  We strive to give you quality time with our providers, and arriving late affects you and other patients whose appointments are after yours.  Also, if you no show three or more times for appointments you may be dismissed from the clinic at the providers discretion.     Again, thank you for choosing [facility].  Our hope is that these requests will decrease the amount of time that you wait before being seen by our physicians.       _____________________________________________________________  Should you have questions after your visit to [facility], please contact our office at [phone] between the hours of 8:30 a.m. and 4:30 p.m.  Voicemails left after 4:30 p.m. will not be returned until the following business day.  For prescription refill requests, have your pharmacy contact our office.       Resources For Cancer Patients and their Caregivers ? American Cancer Society: Can assist with transportation, wigs, general needs, runs Look Good Feel Better.        1-888-227-6333 ? Cancer Care: Provides financial assistance, online support groups, medication/co-pay assistance.  1-800-813-HOPE (4673) ? [facility] Assists [place] cancer patients and their  families through emotional , educational and financial support.  336-427-4357 ? [place] DSS Where to apply for food stamps, Medicaid and utility assistance. 336-342-1394 ? RCATS: Transportation to medical appointments. 336-347-2287 ? Social Security Administration: May apply for disability if have a Stage IV cancer. 336-342-7796 1-800-772-1213 ? [place] Aging, Disability and Transit Services: Assists with nutrition, care and transit needs. 336-349-2343  Cancer Center Support Programs: [email or web address]@ > Cancer Support Group  2nd Tuesday of the month 1pm-2pm, Journey Room  > Creative Journey  3rd Tuesday of the month 1130am-1pm, Journey Room  > Look Good Feel Better  1st Wednesday of the month 10am-12 noon, Journey Room (Call American Cancer Society to register 1-800-395-5775)    

## 2017-10-08 NOTE — Progress Notes (Signed)
[place], [place] [address]   CLINIC:  Medical Oncology/Hematology  PCP:  Abran Richard, MD [address] [place]  [address] [phone]   REASON FOR VISIT:  Follow-up for Iron deficiency anemia AND secondary polycythemia secondary to sleep apnea   CURRENT THERAPY: IV iron prn     INTERVAL HISTORY:  Ms. Christine Foley 56 y.o. female returns for routine follow-up for iron deficiency anemia and secondary polycythemia.   Patient presents today for continue follow-up.  She states overall she has been doing well.  She has not been using her CPAP was seen for her obstructive sleep apnea due to discomfort of the mask.  She denies any chest pain, shortness of breath, abdominal pain, nausea, vomiting, diarrhea.  She denies any bleeding including melena, hematochezia, hematuria, hemoptysis.  She continues to have chronic right knee pain.   REVIEW OF SYSTEMS:  Review of Systems  Constitutional: Positive for fatigue (Mild fatigue). Negative for chills and fever.  HENT:  Negative.  Negative for lump/mass and nosebleeds.   Eyes: Negative.   Respiratory: Negative.  Negative for cough and shortness of breath.   Cardiovascular: Negative.  Negative for chest pain and leg swelling.  Gastrointestinal: Negative.  Negative for abdominal pain, blood in stool, constipation, diarrhea, nausea and vomiting.  Endocrine: Negative.   Genitourinary: Negative for dysuria, hematuria and vaginal bleeding.   Musculoskeletal: Negative.  Negative for arthralgias.  Skin: Negative.  Negative for rash.  Neurological: Negative.  Negative for dizziness and headaches.  Hematological: Negative.  Negative for adenopathy. Does not bruise/bleed easily.  Psychiatric/Behavioral: Negative.  Negative for depression and sleep disturbance. The patient is not nervous/anxious.      PAST MEDICAL/SURGICAL HISTORY:  Past Medical History:  Diagnosis Date  . Anemia   . Arthritis   .  Asthma   . GERD (gastroesophageal reflux disease)   . Hyperlipidemia   . Hypertension   . Hypothyroidism   . 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 Sleep apnea    did not tolerate machine  . Thyroid disease    Past Surgical History:  Procedure Laterality Date  . BIOPSY  01/08/2017   Procedure: BIOPSY;  Surgeon: Danie Binder, MD;  [facility];  Service: Endoscopy;;  cecal polyps x2, ascending colon polyp, duodenal bx's , gastric bx's , gastric polyps bx  . carpel tunnel     right  . CESAREAN SECTION     x 2  . COLONOSCOPY WITH ESOPHAGOGASTRODUODENOSCOPY (EGD)  2008   Dr. Oneida Alar: Sessile polypoid appearing lesion in the midesophagus benign biopsy, normal appearing gastric mucosa, biopsy with chronic gastritis with H pylori. Small bowel looked normal, biopsy negative for celiac. Terminal ileum was normal, colon normal. next TCS 2018.   Marland Kitchen COLONOSCOPY WITH PROPOFOL N/A 01/08/2017   Procedure: COLONOSCOPY WITH PROPOFOL;  Surgeon: Danie Binder, MD;  [facility];  Service: Endoscopy;  Laterality: N/A;  8:45AM  . ESOPHAGOGASTRODUODENOSCOPY (EGD) WITH PROPOFOL N/A 01/08/2017   Procedure: ESOPHAGOGASTRODUODENOSCOPY (EGD) WITH PROPOFOL;  Surgeon: Danie Binder, MD;  [facility];  Service: Endoscopy;  Laterality: N/A;  . GIVENS CAPSULE STUDY N/A 01/31/2017   Procedure: GIVENS CAPSULE STUDY;  Surgeon: Danie Binder, MD;  [facility];  Service: Endoscopy;  Laterality: N/A;  7:30am, patient to arrive at 7:00am  . iron infusion      . POLYPECTOMY  01/08/2017   Procedure: POLYPECTOMY;  Surgeon: Danie Binder, MD;  [facility];  Service: Endoscopy;;  distal transverse colon polyp  . TUBAL LIGATION       SOCIAL HISTORY:  Social History   Socioeconomic History  . Marital status: Married    Spouse name: Not on file  . Number of children: Not on file  . Years of education: Not on file  . Highest education level: Not on file  Social Needs  .  Financial resource strain: Not on file  . Food insecurity - worry: Not on file  . Food insecurity - inability: Not on file  . Transportation needs - medical: Not on file  . Transportation needs - non-medical: Not on file  Occupational History  . Not on file  Tobacco Use  . Smoking status: Never Smoker  . Smokeless tobacco: Never Used  Substance and Sexual Activity  . Alcohol use: No  . Drug use: No  . Sexual activity: Not Currently    Birth control/protection: Surgical  Other Topics Concern  . Not on file  Social History Narrative  . Not on file    FAMILY HISTORY:  Family History  Problem Relation Age of Onset  . Diabetes Mother   . Hypertension Father   . Breast cancer Maternal Grandmother   . Colon cancer Neg Hx     CURRENT MEDICATIONS:  Outpatient Encounter Medications as of 10/08/2017  Medication Sig Note  . acetaminophen (TYLENOL) 500 MG tablet Take 1,000 mg by mouth every 6 (six) hours as needed (for pain/headache).   . benazepril (LOTENSIN) 20 MG tablet Take 20 mg by mouth daily.   . Calcium Carb-Cholecalciferol (CALCIUM/VITAMIN D PO) Take 1 tablet by mouth 2 (two) times daily. 1200-800   . cetirizine (ZYRTEC) 10 MG tablet Take 10 mg by mouth at bedtime.    . cholecalciferol (VITAMIN D) 1000 units tablet Take 1,000 Units by mouth daily.   . fluticasone (FLONASE) 50 MCG/ACT nasal spray Place 1-2 sprays into both nostrils daily as needed (for allergies).    . hydrochlorothiazide (HYDRODIURIL) 25 MG tablet Take 25 mg by mouth daily.   Marland Kitchen ibuprofen (ADVIL,MOTRIN) 800 MG tablet    . levothyroxine (SYNTHROID, LEVOTHROID) 75 MCG tablet Take 75 mcg by mouth daily before breakfast.    . medroxyPROGESTERone (PROVERA) 5 MG tablet TAKE ONE TABLET (5 MG TOTAL) BY MOUTH DAILY FOR 2 WEEKS - REPEAT EVERY 3 MONTHS AND RECORD MENSTRUAL PERIOD 12/27/2016: On hold as on 12/28/16 for 3 months  . Potassium Chloride ER 20 MEQ TBCR 1 PO BID FOR 4 DAYS   . pravastatin (PRAVACHOL) 20 MG tablet  Take 20 mg by mouth at bedtime.    . ranitidine (ZANTAC) 150 MG capsule Take 150 mg by mouth daily before supper.     No facility-administered encounter medications on file as of 10/08/2017.     ALLERGIES:  Allergies  Allergen Reactions  . Penicillins Hives, Shortness Of Breath and Rash    Has patient had a PCN reaction causing immediate rash, facial/tongue/throat swelling, SOB or lightheadedness with hypotension:Yes Has patient had a PCN reaction causing severe rash involving mucus membranes or skin necrosis:unsure Has patient had a PCN reaction that required hospitalization:Treated in ER Has patient had a PCN reaction occurring within the last 10 years:unsure If all of the above answers are "NO", then may proceed with Cephalosporin use.      PHYSICAL EXAM:  ECOG Performance status: 0-1 - Largely asymptomatic   Vitals:   10/08/17 1401  BP: 120/60  Pulse: 87  Resp: 18  Temp: 98.7 F (37.1 C)  SpO2: 99%   Filed Weights   10/08/17 1401  Weight: 265 lb 3.2 oz (120.3 kg)    Physical Exam  Constitutional: She is oriented to person, place, and time and well-developed, well-nourished, and in no distress.  HENT:  Head: Normocephalic.  Mouth/Throat: Oropharynx is clear and moist. No oropharyngeal exudate.  Eyes: Conjunctivae are normal. Pupils are equal, round, and reactive to light. No scleral icterus.  Neck: Normal range of motion. Neck supple.  Cardiovascular: Normal rate and regular rhythm.  Pulmonary/Chest: Effort normal and breath sounds normal. No respiratory distress. She has no rales.  Abdominal: Soft. Bowel sounds are normal. There is no tenderness. There is no rebound and no guarding.  Musculoskeletal: Normal range of motion. She exhibits no edema.  Lymphadenopathy:    She has no cervical adenopathy.       Right: No supraclavicular adenopathy present.       Left: No supraclavicular adenopathy present.  Neurological: She is alert and oriented to person, place, and  time. No cranial nerve deficit. Gait normal.  Skin: Skin is warm and dry. No rash noted.  Psychiatric: Mood, memory, affect and judgment normal.  Nursing note and vitals reviewed.    LABORATORY DATA:  I have reviewed the labs as listed.  CBC    Component Value Date/Time   WBC 8.4 10/08/2017 1323   RBC 5.27 (H) 10/08/2017 1323   HGB 14.4 10/08/2017 1323   HCT 44.7 10/08/2017 1323   PLT 314 10/08/2017 1323   MCV 84.8 10/08/2017 1323   MCH 27.3 10/08/2017 1323   MCHC 32.2 10/08/2017 1323   RDW 13.5 10/08/2017 1323   LYMPHSABS 2.8 10/08/2017 1323   MONOABS 0.4 10/08/2017 1323   EOSABS 0.4 10/08/2017 1323   BASOSABS 0.0 10/08/2017 1323   CMP Latest Ref Rng & Units 01/02/2017 02/09/2016 03/21/2015  Glucose 65 - 99 mg/dL 102(H) 88 106(H)  BUN 6 - 20 mg/dL 14 14 13   Creatinine 0.44 - 1.00 mg/dL 0.61 0.76 0.70  Sodium 135 - 145 mmol/L 135 140 138  Potassium 3.5 - 5.1 mmol/L 3.3(L) 4.0 3.0(L)  Chloride 101 - 111 mmol/L 100(L) 103 99(L)  CO2 22 - 32 mmol/L 28 29 28   Calcium 8.9 - 10.3 mg/dL 9.0 9.0 9.2  Total Protein 6.5 - 8.1 g/dL - 6.9 7.4  Total Bilirubin 0.3 - 1.2 mg/dL - 1.0 1.1  Alkaline Phos 38 - 126 U/L - 57 64  AST 15 - 41 U/L - 19 25  ALT 14 - 54 U/L - 18 21    PENDING LABS:    DIAGNOSTIC IMAGING:  Colonoscopy/EGD: 01/08/17      Video capsule study: 01/31/17       PATHOLOGY:        ASSESSMENT & PLAN:   Iron deficiency anemia:  -Likely secondary to chronic blood loss from menorrhagia. Underwent complete GI evaluation with colonoscopy, EGD, and video capsule study which were all negative for bleeding/etiology of iron deficiency anemia. She stated that she never received the results of these procedures; we reviewed them in detail today. GI blood loss not likely contributing to iron deficiency.  -Hemoglobin 14.4 g/dL today. Her hemoglobin has been stable for months now. -Iron studies pending. Will follow up. -Repeat labs in 6 months.   Secondary  polycythemia: -Encouraged patient on compliance of CPAP machine.    Dispo:  -Return to cancer center in 6 months with labs.    All questions were  answered to patient's stated satisfaction. Encouraged patient to call with any new concerns or questions before her next visit to the cancer center and we can certain see her sooner, if needed.     Orders placed this encounter:  Orders Placed This Encounter  Procedures  . CBC with Differential  . Comprehensive metabolic panel  . Iron and TIBC  . Ferritin   Twana First, MD

## 2017-10-09 ENCOUNTER — Other Ambulatory Visit (HOSPITAL_COMMUNITY): Payer: Self-pay | Admitting: Adult Health

## 2017-10-10 LAB — CYTOLOGY - PAP

## 2017-10-11 ENCOUNTER — Ambulatory Visit (HOSPITAL_COMMUNITY): Payer: BLUE CROSS/BLUE SHIELD

## 2017-10-11 ENCOUNTER — Encounter (HOSPITAL_BASED_OUTPATIENT_CLINIC_OR_DEPARTMENT_OTHER): Payer: BLUE CROSS/BLUE SHIELD

## 2017-10-11 ENCOUNTER — Encounter (HOSPITAL_COMMUNITY): Payer: Self-pay

## 2017-10-11 VITALS — BP 135/81 | HR 77 | Temp 97.6°F | Resp 18

## 2017-10-11 DIAGNOSIS — E611 Iron deficiency: Secondary | ICD-10-CM

## 2017-10-11 DIAGNOSIS — D509 Iron deficiency anemia, unspecified: Secondary | ICD-10-CM

## 2017-10-11 DIAGNOSIS — N924 Excessive bleeding in the premenopausal period: Secondary | ICD-10-CM

## 2017-10-11 DIAGNOSIS — N920 Excessive and frequent menstruation with regular cycle: Secondary | ICD-10-CM | POA: Diagnosis not present

## 2017-10-11 MED ORDER — SODIUM CHLORIDE 0.9% FLUSH: 3.0000 mL | Freq: Once | INTRAVENOUS | Status: DC | PRN

## 2017-10-11 MED ORDER — SODIUM CHLORIDE 0.9 % IV SOLN
Freq: Once | INTRAVENOUS | Status: AC
  Administered 2017-10-11: 10:00:00 via INTRAVENOUS

## 2017-10-11 MED ORDER — FERUMOXYTOL INJECTION 510 MG/17 ML
510.0000 mg | Freq: Once | INTRAVENOUS | Status: AC
  Administered 2017-10-11: 510 mg via INTRAVENOUS
  Filled 2017-10-11: qty 17

## 2017-10-11 NOTE — Patient Instructions (Signed)
[facility] at [facility] Discharge Instructions  RECOMMENDATIONS MADE BY THE CONSULTANT AND ANY TEST RESULTS WILL BE SENT TO YOUR REFERRING PHYSICIAN.  Received Feraheme infusion today.Follow-up as scheduled. Call clinic for any questions or concerns  Thank you for choosing [facility] at [facility] to provide your oncology and hematology care.  To afford each patient quality time with our provider, please arrive at least 15 minutes before your scheduled appointment time.    If you have a lab appointment with the Cancer Center please come in thru the  Main Entrance and check in at the main information desk  You need to re-schedule your appointment should you arrive 10 or more minutes late.  We strive to give you quality time with our providers, and arriving late affects you and other patients whose appointments are after yours.  Also, if you no show three or more times for appointments you may be dismissed from the clinic at the providers discretion.     Again, thank you for choosing [facility].  Our hope is that these requests will decrease the amount of time that you wait before being seen by our physicians.       _____________________________________________________________  Should you have questions after your visit to [facility], please contact our office at [phone] between the hours of 8:30 a.m. and 4:30 p.m.  Voicemails left after 4:30 p.m. will not be returned until the following business day.  For prescription refill requests, have your pharmacy contact our office.       Resources For Cancer Patients and their Caregivers ? American Cancer Society: Can assist with transportation, wigs, general needs, runs Look Good Feel Better.        1-888-227-6333 ? Cancer Care: Provides financial assistance, online support groups, medication/co-pay assistance.  1-800-813-HOPE (4673) ? [facility] Assists [place] cancer patients and their families through emotional , educational and financial support.  336-427-4357 ? [place] DSS Where to apply for food stamps, Medicaid and utility assistance. 336-342-1394 ? RCATS: Transportation to medical appointments. 336-347-2287 ? Social Security Administration: May apply for disability if have a Stage IV cancer. 336-342-7796 1-800-772-1213 ? [place] Aging, Disability and Transit Services: Assists with nutrition, care and transit needs. 336-349-2343  Cancer Center Support Programs: @[email or web address]@ > Cancer Support Group  2nd Tuesday of the month 1pm-2pm, Journey Room  > Creative Journey  3rd Tuesday of the month 1130am-1pm, Journey Room  > Look Good Feel Better  1st Wednesday of the month 10am-12 noon, Journey Room (Call American Cancer Society to register 1-800-395-5775)   

## 2017-10-11 NOTE — Progress Notes (Signed)
Christine Foley tolerated Feraheme infusion well without complaints or incident. VSS upon discharge. Pt discharged self ambulatory in satisfactory condition

## 2017-10-23 ENCOUNTER — Other Ambulatory Visit: Payer: Self-pay | Admitting: Obstetrics and Gynecology

## 2017-10-23 DIAGNOSIS — N840 Polyp of corpus uteri: Secondary | ICD-10-CM

## 2017-10-24 ENCOUNTER — Ambulatory Visit (INDEPENDENT_AMBULATORY_CARE_PROVIDER_SITE_OTHER): Payer: BLUE CROSS/BLUE SHIELD

## 2017-10-24 DIAGNOSIS — N840 Polyp of corpus uteri: Secondary | ICD-10-CM

## 2017-10-24 NOTE — Progress Notes (Signed)
PELVIC US TA/TV: homogeneous anteverted uterus,wnl, EEC 8.7 mm (best visualized on TA images),unable to visualized ovaries,bilat adnexa's wnl,no free fluid,simple nabothian cyst 1.8 x 1.7 x 1.4 cm,no pain during ultrasound,limited ultrasound because of pt body habitus

## 2017-10-31 ENCOUNTER — Ambulatory Visit (INDEPENDENT_AMBULATORY_CARE_PROVIDER_SITE_OTHER): Payer: BLUE CROSS/BLUE SHIELD | Admitting: Obstetrics and Gynecology

## 2017-10-31 ENCOUNTER — Encounter: Payer: Self-pay | Admitting: Obstetrics and Gynecology

## 2017-10-31 ENCOUNTER — Other Ambulatory Visit (HOSPITAL_COMMUNITY)
Admission: RE | Admit: 2017-10-31 | Discharge: 2017-10-31 | Disposition: A | Discharge status: Home / Self Care | Payer: BLUE CROSS/BLUE SHIELD | Source: Ambulatory Visit | Attending: Obstetrics and Gynecology | Admitting: Obstetrics and Gynecology

## 2017-10-31 VITALS — BP 128/76 | HR 88 | Ht 59.0 in | Wt 262.0 lb

## 2017-10-31 DIAGNOSIS — Z01419 Encounter for gynecological examination (general) (routine) without abnormal findings: Secondary | ICD-10-CM | POA: Insufficient documentation

## 2017-10-31 NOTE — Progress Notes (Signed)
Patient ID: Christine Foley, female   DOB: May 10, 1961, 56 y.o.   MRN: [id number]  Assessment:  1. Annual Gyn Exam,  2. Benign endometrial polyp removed 3. Perimenopausal 4. Morbid obesity limits exam  Plan:  1. Return in 3 months for ROS , continue q 3 month provera 2. Consider repeat endometrial biopsy, if continues to spot 3. Every three month, provera x14 days Subjective:  Christine Foley is a 56 y.o. female G2P2 who presents for annual exam. No LMP recorded. The patient was seen on 10/03/2017 and underwent a breast exam and a tissue biopsy. She was scheduled for pap but there was a large endocervical polyp that required removal so pap couldn't be done. . She returns today for a pap smear. She experiences mild likght pink bleeding every once and a while since polypectomy. The patient had an U/S last week on 10/24/2017 that showed an 8 mm endometiral thickness, but the quality of the u/s was very poor due to body habitus. She has had a endometrial biopsy 3 yr ago, proliferative phase endometrium and recent removal of benign endometrial polyp She has states any time she takes provera q 3 months she will have a normal menstrual cycle.   The following portions of the patient's history were reviewed and updated as appropriate: allergies, current medications, past family history, past medical history, past social history, past surgical history and problem list. Past Medical History:  Diagnosis Date  . Anemia   . Arthritis   . Asthma   . GERD (gastroesophageal reflux disease)   . Hyperlipidemia   . Hypertension   . Hypothyroidism   . 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 Sleep apnea    did not tolerate machine  . Thyroid disease     Past Surgical History:  Procedure Laterality Date  . BIOPSY  01/08/2017   Procedure: BIOPSY;  Surgeon: Danie Binder, MD;  [facility]: [facility];  Service: Endoscopy;;  cecal polyps x2, ascending colon polyp, duodenal bx's , gastric bx's , gastric polyps  bx  . carpel tunnel     right  . CESAREAN SECTION     x 2  . COLONOSCOPY WITH ESOPHAGOGASTRODUODENOSCOPY (EGD)  2008   Dr. Oneida Alar: Sessile polypoid appearing lesion in the midesophagus benign biopsy, normal appearing gastric mucosa, biopsy with chronic gastritis with H pylori. Small bowel looked normal, biopsy negative for celiac. Terminal ileum was normal, colon normal. next TCS 2018.   Marland Kitchen COLONOSCOPY WITH PROPOFOL N/A 01/08/2017   Procedure: COLONOSCOPY WITH PROPOFOL;  Surgeon: Danie Binder, MD;  [facility]: [facility];  Service: Endoscopy;  Laterality: N/A;  8:45AM  . ESOPHAGOGASTRODUODENOSCOPY (EGD) WITH PROPOFOL N/A 01/08/2017   Procedure: ESOPHAGOGASTRODUODENOSCOPY (EGD) WITH PROPOFOL;  Surgeon: Danie Binder, MD;  [facility]: [facility];  Service: Endoscopy;  Laterality: N/A;  . GIVENS CAPSULE STUDY N/A 01/31/2017   Procedure: GIVENS CAPSULE STUDY;  Surgeon: Danie Binder, MD;  [facility]: [facility];  Service: Endoscopy;  Laterality: N/A;  7:30am, patient to arrive at 7:00am  . iron infusion      . POLYPECTOMY  01/08/2017   Procedure: POLYPECTOMY;  Surgeon: Danie Binder, MD;  [facility]: [facility];  Service: Endoscopy;;  distal transverse colon polyp  . TUBAL LIGATION       Current Outpatient Medications:  .  acetaminophen (TYLENOL) 500 MG tablet, Take 1,000 mg by mouth every 6 (six) hours as needed (for pain/headache)., Disp: , Rfl:  .  benazepril (LOTENSIN) 20 MG  tablet, Take 20 mg by mouth daily., Disp: , Rfl: 1 .  Calcium Carb-Cholecalciferol (CALCIUM/VITAMIN D PO), Take 1 tablet by mouth 2 (two) times daily. 1200-800, Disp: , Rfl:  .  cetirizine (ZYRTEC) 10 MG tablet, Take 10 mg by mouth at bedtime. , Disp: , Rfl:  .  cholecalciferol (VITAMIN D) 1000 units tablet, Take 1,000 Units by mouth daily., Disp: , Rfl:  .  fluticasone (FLONASE) 50 MCG/ACT nasal spray, Place 1-2 sprays into both nostrils daily as needed (for allergies). , Disp: , Rfl: 0 .  hydrochlorothiazide  (HYDRODIURIL) 25 MG tablet, Take 25 mg by mouth daily., Disp: , Rfl: 0 .  ibuprofen (ADVIL,MOTRIN) 800 MG tablet, , Disp: , Rfl: 0 .  levothyroxine (SYNTHROID, LEVOTHROID) 75 MCG tablet, Take 75 mcg by mouth daily before breakfast. , Disp: , Rfl: 0 .  medroxyPROGESTERone (PROVERA) 5 MG tablet, TAKE ONE TABLET (5 MG TOTAL) BY MOUTH DAILY FOR 2 WEEKS - REPEAT EVERY 3 MONTHS AND RECORD MENSTRUAL PERIOD, Disp: 14 tablet, Rfl: 5 .  Potassium Chloride ER 20 MEQ TBCR, 1 PO BID FOR 4 DAYS, Disp: 8 tablet, Rfl: 0 .  pravastatin (PRAVACHOL) 20 MG tablet, Take 20 mg by mouth at bedtime. , Disp: , Rfl: 0 .  ranitidine (ZANTAC) 150 MG capsule, Take 150 mg by mouth daily before supper. , Disp: , Rfl:   Review of Systems Constitutional: negative Gastrointestinal: negative Genitourinary: negative  Objective:  BP 128/76 (BP Location: Right Arm, Patient Position: Sitting, Cuff Size: Normal)   Pulse 88   Ht 4\' 11"  (1.499 m)   Wt 262 lb (118.8 kg)   BMI 52.92 kg/m    BMI: Body mass index is 52.92 kg/m.  General Appearance: Alert, appropriate appearance for age. No acute distress HEENT: Grossly normal Neck / Thyroid:  Cardiovascular: RRR; normal S1, S2, no murmur Lungs: CTA bilaterally Back: No CVAT Breast Exam: deferred Gastrointestinal: Soft, non-tender, no masses or organomegaly Pelvic Exam:  LIMITED DUE TO OBESITY Vaginal: light pink watery discharge Cervix: normal, pap done  Lymphatic Exam: Non-palpable nodes in neck, clavicular, axillary, or inguinal regions Skin: no rash or abnormalities Neurologic: Normal gait and speech, no tremor  Psychiatric: Alert and oriented, appropriate affect.  Urinalysis:Not done  By signing my name below, I, Margit Banda, attest that this documentation has been prepared under the direction and in the presence of Jonnie Kind, MD. Electronically Signed: Margit Banda, Medical Scribe. 10/31/17. 11:08 AM.  I personally performed the services described in  this documentation, which was SCRIBED in my presence. The recorded information has been reviewed and considered accurate. It has been edited as necessary during review. Jonnie Kind, MD

## 2017-11-01 ENCOUNTER — Other Ambulatory Visit: Payer: Self-pay | Admitting: Obstetrics and Gynecology

## 2017-11-04 LAB — CYTOLOGY - PAP
Diagnosis: NEGATIVE
HPV: NOT DETECTED

## 2017-11-07 ENCOUNTER — Encounter: Payer: Self-pay | Admitting: Radiology

## 2017-11-07 ENCOUNTER — Ambulatory Visit
Admission: RE | Admit: 2017-11-07 | Discharge: 2017-11-07 | Disposition: A | Discharge status: Home / Self Care | Payer: BLUE CROSS/BLUE SHIELD | Source: Ambulatory Visit | Attending: Internal Medicine | Admitting: Internal Medicine

## 2017-11-07 DIAGNOSIS — Z1231 Encounter for screening mammogram for malignant neoplasm of breast: Secondary | ICD-10-CM | POA: Insufficient documentation

## 2018-01-30 ENCOUNTER — Encounter: Payer: Self-pay | Admitting: Obstetrics and Gynecology

## 2018-01-30 ENCOUNTER — Ambulatory Visit: Payer: BLUE CROSS/BLUE SHIELD | Admitting: Obstetrics and Gynecology

## 2018-01-30 VITALS — BP 110/80 | HR 95 | Wt 267.0 lb

## 2018-01-30 DIAGNOSIS — N951 Menopausal and female climacteric states: Secondary | ICD-10-CM

## 2018-01-30 NOTE — Progress Notes (Signed)
[facility] Visit  01/30/2018            Patient name: Christine Foley MRN [id number]  Date of birth: [birth year]  CC & HPI:  Christine Foley is a 57 y.o. female presenting today for follow up after previous visit on 10/03/17  where she had a benign endometrial polyp removed and and 10/31/17, with normal pap smear/well woman exam done. She was given a Provera Rx. Today, pt has no acute complaints or symptoms. No alleviating factors noted. She has not spotted or bled. She had a period in January as a response to Provera. She spotted for three days in February when she was expecting her period. She  used Provera from Dec 31st - Jan 13.   Pathology report for polyp not identified in Epic file right now, but pt recalls receiving report of it being benign  ROS:  ROS (-) vaginal spotting (-) pelvic pain  Pertinent History Reviewed:   Reviewed: Significant for C/S, BTL,  Medical         Past Medical History:  Diagnosis Date  . Anemia   . Arthritis   . Asthma   . GERD (gastroesophageal reflux disease)   . Hyperlipidemia   . Hypertension   . Hypothyroidism   . 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 Sleep apnea    did not tolerate machine  . Thyroid disease                               Surgical Hx:    Past Surgical History:  Procedure Laterality Date  . BIOPSY  01/08/2017   Procedure: BIOPSY;  Surgeon: Danie Binder, MD;  [facility]: [facility];  Service: Endoscopy;;  cecal polyps x2, ascending colon polyp, duodenal bx's , gastric bx's , gastric polyps bx  . carpel tunnel     right  . CESAREAN SECTION     x 2  . COLONOSCOPY WITH ESOPHAGOGASTRODUODENOSCOPY (EGD)  2008   Dr. Oneida Alar: Sessile polypoid appearing lesion in the midesophagus benign biopsy, normal appearing gastric mucosa, biopsy with chronic gastritis with H pylori. Small bowel looked normal, biopsy negative for celiac. Terminal ileum was normal, colon normal. next TCS 2018.   Marland Kitchen COLONOSCOPY WITH PROPOFOL N/A  01/08/2017   Procedure: COLONOSCOPY WITH PROPOFOL;  Surgeon: Danie Binder, MD;  [facility];  Service: Endoscopy;  Laterality: N/A;  8:45AM  . ESOPHAGOGASTRODUODENOSCOPY (EGD) WITH PROPOFOL N/A 01/08/2017   Procedure: ESOPHAGOGASTRODUODENOSCOPY (EGD) WITH PROPOFOL;  Surgeon: Danie Binder, MD;  [facility];  Service: Endoscopy;  Laterality: N/A;  . GIVENS CAPSULE STUDY N/A 01/31/2017   Procedure: GIVENS CAPSULE STUDY;  Surgeon: Danie Binder, MD;  [facility];  Service: Endoscopy;  Laterality: N/A;  7:30am, patient to arrive at 7:00am  . iron infusion      . POLYPECTOMY  01/08/2017   Procedure: POLYPECTOMY;  Surgeon: Danie Binder, MD;  [facility];  Service: Endoscopy;;  distal transverse colon polyp  . TUBAL LIGATION     Medications: Reviewed & Updated - see associated section                       Current Outpatient Medications:  .  acetaminophen (TYLENOL) 500 MG tablet, Take 1,000 mg by mouth every 6 (six) hours as needed (for pain/headache)., Disp: , Rfl:  .  benazepril (LOTENSIN) 20  MG tablet, Take 20 mg by mouth daily., Disp: , Rfl: 1 .  Calcium Carb-Cholecalciferol (CALCIUM/VITAMIN D PO), Take 1 tablet by mouth 2 (two) times daily. 1200-800, Disp: , Rfl:  .  cetirizine (ZYRTEC) 10 MG tablet, Take 10 mg by mouth at bedtime. , Disp: , Rfl:  .  cholecalciferol (VITAMIN D) 1000 units tablet, Take 1,000 Units by mouth daily., Disp: , Rfl:  .  fluticasone (FLONASE) 50 MCG/ACT nasal spray, Place 1-2 sprays into both nostrils daily as needed (for allergies). , Disp: , Rfl: 0 .  hydrochlorothiazide (HYDRODIURIL) 25 MG tablet, Take 25 mg by mouth daily., Disp: , Rfl: 0 .  ibuprofen (ADVIL,MOTRIN) 800 MG tablet, , Disp: , Rfl: 0 .  levothyroxine (SYNTHROID, LEVOTHROID) 75 MCG tablet, Take 75 mcg by mouth daily before breakfast. , Disp: , Rfl: 0 .  medroxyPROGESTERone (PROVERA) 5 MG tablet, TAKE ONE TABLET BY MOUTH DAILY FOR 2 WEEKS. REPEAT EVERY 3  MONTHS AND RECORD MENSTRUAL PERIOD, Disp: 14 tablet, Rfl: 5 .  pravastatin (PRAVACHOL) 20 MG tablet, Take 20 mg by mouth at bedtime. , Disp: , Rfl: 0 .  ranitidine (ZANTAC) 150 MG capsule, Take 150 mg by mouth daily before supper. , Disp: , Rfl:  .  Potassium Chloride ER 20 MEQ TBCR, 1 PO BID FOR 4 DAYS (Patient not taking: Reported on 01/30/2018), Disp: 8 tablet, Rfl: 0   Social History: Reviewed -  reports that she has never smoked. She has never used smokeless tobacco.  Objective Findings:  Vitals: Blood pressure 110/80, pulse 95, weight 267 lb (121.1 kg).  PHYSICAL EXAMINATION General appearance - alert, well appearing, and in no distress, oriented to person, place, and time and overweight Mental status - alert, oriented to person, place, and time, normal mood, behavior, speech, dress, motor activity, and thought processes  PELVIC not indicated  Discussion: 1. Discussed with pt her use of Provera, and how her menstrual cycles have been after her polyp removal. Pt advised to contact office if she bleeds out of her ordinary cycle.  At end of discussion, pt had opportunity to ask questions and has no further questions at this time.   Specific discussion as noted above. Greater than 50% was spent in counseling and coordination of care with the patient.   Total time greater than: 25 minutes.    Assessment & Plan:   A:  1.  Continue taking Provera once every 3 months x14 d  P:  1. [place] value report and estradiol levels to be checked today 2.  Transvaginal U/S after next period, pt will call when she is finished bleeding 3.  Pt to notify me of any abnormal bleeding    By signing my name below, I, [name], attest that this documentation has been prepared under the direction and in the presence of Jonnie Kind, MD. Electronically Signed: Jabier Gauss, Medical Scribe. 01/30/18. 2:10 PM.  I personally performed the services described in this documentation, which was SCRIBED in  my presence. The recorded information has been reviewed and considered accurate. It has been edited as necessary during review. Jonnie Kind, MD

## 2018-01-31 LAB — FOLLICLE STIMULATING HORMONE: FSH: 29.8 m[IU]/mL

## 2018-01-31 LAB — ESTRADIOL: ESTRADIOL: 30.8 pg/mL

## 2018-02-25 ENCOUNTER — Telehealth: Payer: Self-pay | Admitting: *Deleted

## 2018-02-25 NOTE — Telephone Encounter (Signed)
Patient states she is still bleeding and is to call to schedule u/s when she stops.  Informed I did see that in his note but would f/u to make sure she didn't need to come sooner since her [place] values have resulted.  Does patient need U/S first after she stops bleeding or does she need to come in for endometrial biopsy? Please advise.

## 2018-03-04 NOTE — Telephone Encounter (Signed)
Please schedule for an u/s gyn TV here

## 2018-03-05 ENCOUNTER — Telehealth: Payer: Self-pay | Admitting: *Deleted

## 2018-03-06 NOTE — Telephone Encounter (Signed)
Patient informed she needed U/S here in our office.  States she is already scheduled for an U/S in our office. Advised that was fine and we would see her at her next appointment.

## 2018-03-10 ENCOUNTER — Other Ambulatory Visit: Payer: Self-pay | Admitting: Obstetrics and Gynecology

## 2018-03-10 DIAGNOSIS — N939 Abnormal uterine and vaginal bleeding, unspecified: Secondary | ICD-10-CM

## 2018-03-11 ENCOUNTER — Ambulatory Visit (INDEPENDENT_AMBULATORY_CARE_PROVIDER_SITE_OTHER): Payer: BLUE CROSS/BLUE SHIELD

## 2018-03-11 DIAGNOSIS — N888 Other specified noninflammatory disorders of cervix uteri: Secondary | ICD-10-CM

## 2018-03-11 DIAGNOSIS — N939 Abnormal uterine and vaginal bleeding, unspecified: Secondary | ICD-10-CM

## 2018-03-11 NOTE — Progress Notes (Signed)
PELVIC US TA/TV:homogeneous anteverted uterus,wnl,EEC 7 mm on T/A images,13 mm on T/V images,limited view because of pt body habitus,normal ovaries bilat,simple nabothian cyst 1.6 x 1.4 x 1.7 cm,no free fluid,no pain during ultrasound,transvaginal images are better with abdominal pressure

## 2018-03-17 ENCOUNTER — Telehealth: Payer: Self-pay | Admitting: Obstetrics and Gynecology

## 2018-03-18 NOTE — Telephone Encounter (Signed)
Ultrasound has been reviewed.  The quality of the ultrasound is limited but there appears to be some endometrial thickening up to 13 mm on some pictures.  That suggests that there is leftover remnant of the endometrial polyp that was partially removed in the past when it prolapsed to the the cervix.  Again the pathology report cannot be found in the epic electronic record  I have explained this to the patient and recommended that if she wants to avoid further breakthrough bleeding we could proceed with a hysteroscopy D&C which is her requested treatment.  She is to come in this Friday for a preop visit.  Phone call transferred to the front

## 2018-03-21 ENCOUNTER — Ambulatory Visit: Payer: BLUE CROSS/BLUE SHIELD | Admitting: Obstetrics and Gynecology

## 2018-03-21 ENCOUNTER — Encounter: Payer: Self-pay | Admitting: Obstetrics and Gynecology

## 2018-03-21 ENCOUNTER — Other Ambulatory Visit: Payer: Self-pay

## 2018-03-21 VITALS — BP 130/84 | HR 107 | Ht 59.0 in | Wt 258.0 lb

## 2018-03-21 DIAGNOSIS — B354 Tinea corporis: Secondary | ICD-10-CM

## 2018-03-21 DIAGNOSIS — N950 Postmenopausal bleeding: Secondary | ICD-10-CM | POA: Diagnosis not present

## 2018-03-21 DIAGNOSIS — N9089 Other specified noninflammatory disorders of vulva and perineum: Secondary | ICD-10-CM

## 2018-03-21 MED ORDER — MICONAZOLE NITRATE 2 % EX CREA: 1.0000 "application " | TOPICAL_CREAM | Freq: Two times a day (BID) | CUTANEOUS | 99 refills | Status: DC

## 2018-03-21 NOTE — Progress Notes (Signed)
Preoperative History and Physical  Christine Foley is a 57 y.o. G2P2 here for surgical management of postmenopausal bleeding associated with an previous endometrial polyp that was partially removed when it was found to be prolapsing through the cervix we spoke recently and she is having continued intermittent spotting.  Ultrasound suggested that the endometrium was somewhat thickened on a poor quality ultrasound.  We will do a hysteroscopy D&C to remove what cyst suspected to be endometrial polyp remnant.   No significant preoperative concerns.  Proposed surgery: Hysteroscopy D&C with removal of endometrial polyp  Past Medical History:  Diagnosis Date  . Anemia   . Arthritis   . Asthma   . GERD (gastroesophageal reflux disease)   . Hyperlipidemia   . Hypertension   . Hypothyroidism   . 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 Sleep apnea    did not tolerate machine  . Thyroid disease    Past Surgical History:  Procedure Laterality Date  . BIOPSY  01/08/2017   Procedure: BIOPSY;  Surgeon: Danie Binder, MD;  [facility];  Service: Endoscopy;;  cecal polyps x2, ascending colon polyp, duodenal bx's , gastric bx's , gastric polyps bx  . carpel tunnel     right  . CESAREAN SECTION     x 2  . COLONOSCOPY WITH ESOPHAGOGASTRODUODENOSCOPY (EGD)  2008   Dr. Oneida Alar: Sessile polypoid appearing lesion in the midesophagus benign biopsy, normal appearing gastric mucosa, biopsy with chronic gastritis with H pylori. Small bowel looked normal, biopsy negative for celiac. Terminal ileum was normal, colon normal. next TCS 2018.   Marland Kitchen COLONOSCOPY WITH PROPOFOL N/A 01/08/2017   Procedure: COLONOSCOPY WITH PROPOFOL;  Surgeon: Danie Binder, MD;  [facility];  Service: Endoscopy;  Laterality: N/A;  8:45AM  . ESOPHAGOGASTRODUODENOSCOPY (EGD) WITH PROPOFOL N/A 01/08/2017   Procedure: ESOPHAGOGASTRODUODENOSCOPY (EGD) WITH PROPOFOL;  Surgeon: Danie Binder, MD;  [facility];   Service: Endoscopy;  Laterality: N/A;  . GIVENS CAPSULE STUDY N/A 01/31/2017   Procedure: GIVENS CAPSULE STUDY;  Surgeon: Danie Binder, MD;  [facility];  Service: Endoscopy;  Laterality: N/A;  7:30am, patient to arrive at 7:00am  . iron infusion      . POLYPECTOMY  01/08/2017   Procedure: POLYPECTOMY;  Surgeon: Danie Binder, MD;  [facility];  Service: Endoscopy;;  distal transverse colon polyp  . TUBAL LIGATION     OB History  Gravida Para Term Preterm AB Living  2 2       2   SAB TAB Ectopic Multiple Live Births               # Outcome Date GA Lbr Len/2nd Weight Sex Delivery Anes PTL Lv  2 Para           1 Para           Patient denies any other pertinent gynecologic issues.   Current Outpatient Medications on File Prior to Visit  Medication Sig Dispense Refill  . acetaminophen (TYLENOL) 500 MG tablet Take 1,000 mg by mouth every 6 (six) hours as needed (for pain/headache).    . benazepril (LOTENSIN) 20 MG tablet Take 20 mg by mouth daily.  1  . Calcium Carb-Cholecalciferol (CALCIUM/VITAMIN D PO) Take 1 tablet by mouth 2 (two) times daily. 1200-800    . cetirizine (ZYRTEC) 10 MG tablet Take 10 mg by mouth at bedtime.     . cholecalciferol (VITAMIN D) 1000 units tablet Take  1,000 Units by mouth daily.    . fluticasone (FLONASE) 50 MCG/ACT nasal spray Place 1-2 sprays into both nostrils daily as needed (for allergies).   0  . hydrochlorothiazide (HYDRODIURIL) 25 MG tablet Take 25 mg by mouth daily.  0  . ibuprofen (ADVIL,MOTRIN) 800 MG tablet   0  . levothyroxine (SYNTHROID, LEVOTHROID) 75 MCG tablet Take 75 mcg by mouth daily before breakfast.   0  . medroxyPROGESTERone (PROVERA) 5 MG tablet TAKE ONE TABLET BY MOUTH DAILY FOR 2 WEEKS. REPEAT EVERY 3 MONTHS AND RECORD MENSTRUAL PERIOD 14 tablet 5  . pravastatin (PRAVACHOL) 20 MG tablet Take 20 mg by mouth at bedtime.   0  . ranitidine (ZANTAC) 150 MG capsule Take 150 mg by mouth daily before supper.     .  Potassium Chloride ER 20 MEQ TBCR 1 PO BID FOR 4 DAYS (Patient not taking: Reported on 01/30/2018) 8 tablet 0   No current facility-administered medications on file prior to visit.    Allergies  Allergen Reactions  . Penicillins Hives, Shortness Of Breath and Rash    Has patient had a PCN reaction causing immediate rash, facial/tongue/throat swelling, SOB or lightheadedness with hypotension:Yes Has patient had a PCN reaction causing severe rash involving mucus membranes or skin necrosis:unsure Has patient had a PCN reaction that required hospitalization:Treated in ER Has patient had a PCN reaction occurring within the last 10 years:unsure If all of the above answers are "NO", then may proceed with Cephalosporin use.     Social History:   reports that she has never smoked. She has never used smokeless tobacco. She reports that she does not drink alcohol or use drugs.  Family History  Problem Relation Age of Onset  . Diabetes Mother   . Hypertension Father   . Breast cancer Maternal Grandmother   . Colon cancer Neg Hx     Review of Systems: Noncontributory  PHYSICAL EXAM: Blood pressure 130/84, pulse (!) 107, height 4\' 11"  (1.499 m), weight 258 lb (117 kg). General appearance - alert, well appearing, and in no distress Chest - clear to auscultation, no wheezes, rales or rhonchi, symmetric air entry Heart - normal rate and regular rhythm Abdomen - soft, nontender, nondistended, no masses or organomegaly                     Well-healed C-section scars, exam limited by patient obesity Pelvic - examination significant vaginal length long narrow speculum required nulliparous cervix uterus anteflexedx Extremities - peripheral pulses normal, no pedal edema, no clubbing or cyanosis  Labs:  Imaging Studies: No results found.  Assessment: Postmenopausal bleeding due to endometrial polyp remnant 2.  Multiple skin tags in her thighs and under breast we will remove Tinea corporis under  her breasts bilaterally, prescribe miconazole Exogenous obesity Body mass index is 52.11 kg/m.    Patient Active Problem List   Diagnosis Date Noted  . Iron deficiency anemia   . GERD (gastroesophageal reflux disease) 12/24/2016  . 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 Iron deficiency 10/09/2016  . Abnormal perimenopausal bleeding 04/12/2015  Regimens obesity  Plan: Patient will undergo surgical management with 1.  Hysteroscopy D&C with removal of endometrial polyp 2 removal of multiple skin tags inner thigh and under breast 3.  Treated with miconazole for tinea corporis under the breast.   Current Outpatient Medications on File Prior to Visit  Medication Sig Dispense Refill  . acetaminophen (TYLENOL) 500 MG tablet Take 1,000 mg by mouth every 6 (  six) hours as needed (for pain/headache).    . benazepril (LOTENSIN) 20 MG tablet Take 20 mg by mouth daily.  1  . Calcium Carb-Cholecalciferol (CALCIUM/VITAMIN D PO) Take 1 tablet by mouth 2 (two) times daily. 1200-800    . cetirizine (ZYRTEC) 10 MG tablet Take 10 mg by mouth at bedtime.     . cholecalciferol (VITAMIN D) 1000 units tablet Take 1,000 Units by mouth daily.    . fluticasone (FLONASE) 50 MCG/ACT nasal spray Place 1-2 sprays into both nostrils daily as needed (for allergies).   0  . hydrochlorothiazide (HYDRODIURIL) 25 MG tablet Take 25 mg by mouth daily.  0  . ibuprofen (ADVIL,MOTRIN) 800 MG tablet   0  . levothyroxine (SYNTHROID, LEVOTHROID) 75 MCG tablet Take 75 mcg by mouth daily before breakfast.   0  . medroxyPROGESTERone (PROVERA) 5 MG tablet TAKE ONE TABLET BY MOUTH DAILY FOR 2 WEEKS. REPEAT EVERY 3 MONTHS AND RECORD MENSTRUAL PERIOD 14 tablet 5  . pravastatin (PRAVACHOL) 20 MG tablet Take 20 mg by mouth at bedtime.   0  . ranitidine (ZANTAC) 150 MG capsule Take 150 mg by mouth daily before supper.     . Potassium Chloride ER 20 MEQ TBCR 1 PO BID FOR 4 DAYS (Patient not taking: Reported on 01/30/2018) 8 tablet 0   No  current facility-administered medications on file prior to visit.    Jonnie Kind, MD  .mec 03/21/2018 10:39 AM

## 2018-03-21 NOTE — Patient Instructions (Signed)
miconMiconazole skin cream What is this medicine? MICONAZOLE (mi KON a zole) is an antifungal medicine. It is used to treat certain kinds of fungal or yeast infections of the skin. This medicine may be used for other purposes; ask your health care provider or pharmacist if you have questions. COMMON BRAND NAME(S): Aloe Vesta, Antifungal, Baza, Micaderm, Micatin, Monistat-Derm, Neosporin AF, Novana Anti-Fungal, Remedy, Soothe & Cool INZO What should I tell my health care provider before I take this medicine? They need to know if you have any of these conditions: -diabetes -HIV or AIDS -immune system problems -other chronic health condition -recent chemotherapy treatments -an unusual or allergic reaction to miconazole, other medicines, foods, dyes or preservatives -pregnant or trying to get pregnant -breast-feeding How should I use this medicine? This medicine is for external use only. Do not take by mouth. Follow the directions on the label. Wash hands before and after use. If treating hands, only wash hands before use. Cleanse and dry affected area thoroughly. Apply a thin layer of medicine to cover the affected skin and surrounding area. You can cover the area with a sterile gauze dressing (bandage). Do not use an airtight bandage (such as a plastic-covered bandage). Do not use your medicine more often than directed. Use this medicine for the full amount of time recommended on the package or by your doctor or health care professional even if you begin to feel better. Do not use for more than 4 weeks without advice. Talk to your pediatrician regarding the use of this medicine in children. While this medicine may be prescribed for children as young as 2 years for selected conditions, precautions do apply. Overdosage: If you think you have taken too much of this medicine contact a poison control center or emergency room at once. NOTE: This medicine is only for you. Do not share this medicine with  others. What if I miss a dose? If you miss a dose, use it as soon as you can. If it is almost time for your next dose, use only that dose. Do not use double or extra doses. What may interact with this medicine? Interactions are not expected. Do not use any other skin products on the affected area without asking your doctor or health care professional. This list may not describe all possible interactions. Give your health care provider a list of all the medicines, herbs, non-prescription drugs, or dietary supplements you use. Also tell them if you smoke, drink alcohol, or use illegal drugs. Some items may interact with your medicine. What should I watch for while using this medicine? Tell your doctor or healthcare professional if your symptoms do not start to get better or if they get worse. You may have a skin infection that does not respond to this medicine. Do not get this medicine in your eyes. If you do, rinse out with plenty of cool tap water. If you are using this medicine for 'jock itch' be sure to dry the groin completely after bathing. Do not wear underwear that is tight-fitting or made from synthetic fibers like rayon or nylon. Wear loose-fitting, cotton underwear. If you are using this medicine for athlete's foot be sure to dry your feet carefully after bathing, especially between the toes. Do not wear socks made from wool or synthetic materials like rayon or nylon. Wear clean cotton socks and change them at least once a day, change them more if your feet sweat a lot. Also, try to wear sandals or shoes that are  well-ventilated. What side effects may I notice from receiving this medicine? Side effects that you should report to your doctor or health care professional as soon as possible: -allergic reactions like skin rash, itching or hives, swelling of the face, lips, or tongue -increased inflammation, redness, or pain at the affected area Side effects that usually do not require medical  attention (report to your doctor or health care professional if they continue or are bothersome): -mild skin irritation, burning, or itching at the affected area This list may not describe all possible side effects. Call your doctor for medical advice about side effects. You may report side effects to FDA at 1-800-FDA-1088. Where should I keep my medicine? Keep out of the reach of children. Store at room temperature between 15 and 30 degrees C (59 and 86 degrees F). Throw away any unused medicine after the expiration date. NOTE: This sheet is a summary. It may not cover all possible information. If you have questions about this medicine, talk to your doctor, pharmacist, or health care provider.  2018 Elsevier/Gold Standard (2008-04-29 10:57:16)

## 2018-03-31 ENCOUNTER — Other Ambulatory Visit (HOSPITAL_COMMUNITY): Payer: Self-pay | Admitting: *Deleted

## 2018-03-31 DIAGNOSIS — D751 Secondary polycythemia: Secondary | ICD-10-CM

## 2018-04-01 ENCOUNTER — Inpatient Hospital Stay (HOSPITAL_COMMUNITY): Payer: BLUE CROSS/BLUE SHIELD | Attending: Hematology

## 2018-04-01 DIAGNOSIS — D509 Iron deficiency anemia, unspecified: Secondary | ICD-10-CM | POA: Diagnosis present

## 2018-04-01 DIAGNOSIS — D751 Secondary polycythemia: Secondary | ICD-10-CM

## 2018-04-01 LAB — CBC WITH DIFFERENTIAL/PLATELET
Basophils Absolute: 0 10*3/uL (ref 0.0–0.1)
Basophils Relative: 1 %
EOS ABS: 0.4 10*3/uL (ref 0.0–0.7)
Eosinophils Relative: 5 %
HEMATOCRIT: 44.3 % (ref 36.0–46.0)
HEMOGLOBIN: 14.5 g/dL (ref 12.0–15.0)
Lymphocytes Relative: 37 %
Lymphs Abs: 2.9 10*3/uL (ref 0.7–4.0)
MCH: 27.9 pg (ref 26.0–34.0)
MCHC: 32.7 g/dL (ref 30.0–36.0)
MCV: 85.2 fL (ref 78.0–100.0)
MONOS PCT: 5 %
Monocytes Absolute: 0.4 10*3/uL (ref 0.1–1.0)
NEUTROS ABS: 4.1 10*3/uL (ref 1.7–7.7)
NEUTROS PCT: 52 %
Platelets: 292 10*3/uL (ref 150–400)
RBC: 5.2 MIL/uL — ABNORMAL HIGH (ref 3.87–5.11)
RDW: 12.9 % (ref 11.5–15.5)
WBC: 7.7 10*3/uL (ref 4.0–10.5)

## 2018-04-01 LAB — COMPREHENSIVE METABOLIC PANEL
ALBUMIN: 3.9 g/dL (ref 3.5–5.0)
ALT: 21 U/L (ref 14–54)
AST: 19 U/L (ref 15–41)
Alkaline Phosphatase: 56 U/L (ref 38–126)
Anion gap: 9 (ref 5–15)
BUN: 16 mg/dL (ref 6–20)
CHLORIDE: 96 mmol/L — AB (ref 101–111)
CO2: 29 mmol/L (ref 22–32)
Calcium: 9 mg/dL (ref 8.9–10.3)
Creatinine, Ser: 0.63 mg/dL (ref 0.44–1.00)
GFR calc Af Amer: >60 mL/min (ref >60)
GFR calc non Af Amer: >60 mL/min (ref >60)
GLUCOSE: 95 mg/dL (ref 65–99)
POTASSIUM: 3.4 mmol/L — AB (ref 3.5–5.1)
Sodium: 134 mmol/L — ABNORMAL LOW (ref 135–145)
Total Bilirubin: 0.9 mg/dL (ref 0.3–1.2)
Total Protein: 7 g/dL (ref 6.5–8.1)

## 2018-04-01 LAB — IRON AND TIBC
Iron: 50 ug/dL (ref 28–170)
Saturation Ratios: 15 % (ref 10.4–31.8)
TIBC: 335 ug/dL (ref 250–450)
UIBC: 285 ug/dL

## 2018-04-01 LAB — FERRITIN: Ferritin: 45 ng/mL (ref 11–307)

## 2018-04-07 NOTE — Patient Instructions (Signed)
Christine Foley  04/07/2018     @PREFPERIOPPHARMACY @   Your procedure is scheduled on 04/15/2018.  Report to [facility] at 8:30 A.M.  Call this number if you have problems the morning of surgery:  [phone]   Remember:  No food or drink after midnight.      Take these medicines the morning of surgery with A SIP OF WATER Albuterol inhaler, Lotensin, Zyrtec, Flonase, Synthroid, Provera, Zantac    Do not wear jewelry, make-up or nail polish.  Do not wear lotions, powders, or perfumes, or deodorant.  Do not shave 48 hours prior to surgery.  Men may shave face and neck.  Do not bring valuables to the hospital.  [facility] is not responsible for any belongings or valuables.  Contacts, dentures or bridgework may not be worn into surgery.  Leave your suitcase in the car.  After surgery it may be brought to your room.  For patients admitted to the hospital, discharge time will be determined by your treatment team.  Patients discharged the day of surgery will not be allowed to drive home.    Please read over the following fact sheets that you were given. Surgical Site Infection Prevention and Anesthesia Post-op Instructions     PATIENT INSTRUCTIONS POST-ANESTHESIA  IMMEDIATELY FOLLOWING SURGERY:  Do not drive or operate machinery for the first twenty four hours after surgery.  Do not make any important decisions for twenty four hours after surgery or while taking narcotic pain medications or sedatives.  If you develop intractable nausea and vomiting or a severe headache please notify your doctor immediately.  FOLLOW-UP:  Please make an appointment with your surgeon as instructed. You do not need to follow up with anesthesia unless specifically instructed to do so.  WOUND CARE INSTRUCTIONS (if applicable):  Keep a dry clean dressing on the anesthesia/puncture wound site if there is drainage.  Once the wound has quit draining you may leave it open to air.  Generally you should  leave the bandage intact for twenty four hours unless there is drainage.  If the epidural site drains for more than 36-48 hours please call the anesthesia department.  QUESTIONS?:  Please feel free to call your physician or the hospital operator if you have any questions, and they will be happy to assist you.      Endocervical Curettage Endocervical curettage is a procedure that is used to get a tissue sample from the lining of the endocervical canal. The endocervical canal is thearea between the cervix and the uterus. The tissue sample is then tested for abnormal cells. This procedure is sometimes done as part of a colposcopy, which is a procedure that examines the cervix, the vagina, and the area around the vaginal opening (vulva) for abnormalities or signs of disease. Tell a health care provider about:  Any allergies you have.  All medicines you are taking, including vitamins, herbs, eye drops, creams, and over-the-counter medicines.  Any problems you or family members have had with anesthetic medicines.  Any blood disorders you have.  Any surgeries you have had.  Any medical conditions you have.  Whether you are pregnant or may be pregnant.  Any recent vaginal infections.  Any recent menstrual periods or bleeding. What are the risks? Generally, this is a safe procedure. However, problems may occur, including:  Infection.  Bleeding.  Allergic reactions to medicines.  Damage to the cervix or other organs.  What happens before the procedure?  For 24 hours before  the procedure, or as told by your health care provider, do not: ? Douche. ? Use tampons. ? Use medicines, creams, or suppositories in the vagina. ? Have sex.  Ask your health care provider about: ? Changing or stopping your regular medicines. This is especially important if you are taking diabetes medicines or blood thinners. ? Taking medicines such as aspirin and ibuprofen. These medicines can thin your blood.  Do not take these medicines before your procedure if your health care provider instructs you not to.  Follow instructions from your health care provider about eating or drinking restrictions.  You may have a vaginal exam or an ultrasound.  You may have a blood sample or a sample from your uterus taken.  You may be asked to empty your bladder just before the procedure. What happens during the procedure?  You will lie down on your back with your feet in foot rests (stirrups).  A lubricated instrument (speculum) will be inserted into your vagina. The speculum will be opened to widen the walls of your vagina so your health care provider can see your cervix.  A medicine will be applied to numb your cervix and the surrounding area (local anesthetic).  A curved instrument (curette) will be used to scrape cells from the endocervical canal. The curette will then be removed. The cells will be sent to a lab for testing.  The speculum will be removed.  To stop any bleeding, medicines may be put on the area where cells were removed. The procedure may vary among health care providers and hospitals. What happens after the procedure?  After the procedure, you may have mild cramping and pain. Your health care provider may give you medicines to control this.  You may have a small amount of vaginal bleeding (spotting). You will need to use a sanitary napkin for this.  It is up to you to get the results of your procedure. Ask your health care provider, or the department that is doing the procedure, when your results will be ready. Summary  Endocervical curettage is a procedure that is used to get a tissue sample from the lining of the endocervical canal. The tissue sample is then tested for abnormal cells.  Follow instructions from your health care provider about restrictions before the procedure.  After the procedure, you may have pain, vaginal bleeding, and mild cramping. This information is not  intended to replace advice given to you by your health care provider. Make sure you discuss any questions you have with your health care provider. Document Released: 07/31/2008 Document Revised: 11/26/2016 Document Reviewed: 11/26/2016 Elsevier Interactive Patient Education  [employer].

## 2018-04-08 ENCOUNTER — Other Ambulatory Visit: Payer: Self-pay | Admitting: Obstetrics and Gynecology

## 2018-04-08 ENCOUNTER — Encounter (HOSPITAL_COMMUNITY): Payer: Self-pay | Admitting: Internal Medicine

## 2018-04-08 ENCOUNTER — Inpatient Hospital Stay (HOSPITAL_COMMUNITY): Payer: BLUE CROSS/BLUE SHIELD | Attending: Hematology | Admitting: Internal Medicine

## 2018-04-08 VITALS — BP 133/82 | HR 79 | Temp 98.4°F | Resp 18 | Wt 258.0 lb

## 2018-04-08 DIAGNOSIS — E039 Hypothyroidism, unspecified: Secondary | ICD-10-CM | POA: Insufficient documentation

## 2018-04-08 DIAGNOSIS — D751 Secondary polycythemia: Secondary | ICD-10-CM | POA: Insufficient documentation

## 2018-04-08 DIAGNOSIS — E611 Iron deficiency: Secondary | ICD-10-CM

## 2018-04-08 DIAGNOSIS — D500 Iron deficiency anemia secondary to blood loss (chronic): Secondary | ICD-10-CM | POA: Diagnosis present

## 2018-04-08 DIAGNOSIS — N920 Excessive and frequent menstruation with regular cycle: Secondary | ICD-10-CM | POA: Insufficient documentation

## 2018-04-08 DIAGNOSIS — I10 Essential (primary) hypertension: Secondary | ICD-10-CM | POA: Diagnosis not present

## 2018-04-08 NOTE — Progress Notes (Signed)
Diagnosis Iron deficiency - Plan: CBC with Differential/Platelet, Comprehensive metabolic panel, Lactate dehydrogenase, Ferritin  Staging Cancer Staging No matching staging information was found for the patient.  Assessment and Plan:  1.  Iron deficiency anemia. Felt secondary to chronic blood loss from menorrhagia. Underwent complete GI evaluation with colonoscopy, EGD, and video capsule study which were all negative for bleeding/etiology of iron deficiency anemia. Pt was last treated with IV iron in 10/2017.    Labs done 04/01/2018 and reviewed with pt show WBC 7.7, HB 14.5 and plts 292,000.  Ferritin 45.  Counts are stable.  She will RTC in 6 months for repeat labs.  Follow-up with GI as recommended.    2.  HTN.  BP is 133/82.  Follow-up with GI as recommended.    3.  Hypothyroidism.  Pt on Synthroid.  Follow-up with PCP for monitoring.    4.  Health maintenance.  Continue GI and mammogram evaluations as recommended.     Current Status:  Pt is seen today for follow-up.  She is here to go over labs.    Problem List Patient Active Problem List   Diagnosis Date Noted  . PMB (postmenopausal bleeding) [N95.0] 03/21/2018  . Obesity, morbid, BMI 50 or higher ([place]) [E66.01] 03/21/2018  . Iron deficiency anemia [D50.9]   . GERD (gastroesophageal reflux disease) [K21.9] 12/24/2016  . 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 Iron deficiency [E61.1] 10/09/2016  . Abnormal perimenopausal bleeding [N92.4] 04/12/2015    Past Medical History Past Medical History:  Diagnosis Date  . Anemia   . Arthritis   . Asthma   . GERD (gastroesophageal reflux disease)   . Hyperlipidemia   . Hypertension   . Hypothyroidism   . 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 Sleep apnea    did not tolerate machine  . Thyroid disease     Past Surgical History Past Surgical History:  Procedure Laterality Date  . BIOPSY  01/08/2017   Procedure: BIOPSY;  Surgeon: Danie Binder, MD;  [facility];  Service: Endoscopy;;  cecal polyps x2, ascending colon polyp, duodenal bx's , gastric bx's , gastric polyps bx  . carpel tunnel     right  . CESAREAN SECTION     x 2  . COLONOSCOPY WITH ESOPHAGOGASTRODUODENOSCOPY (EGD)  2008   Dr. Oneida Alar: Sessile polypoid appearing lesion in the midesophagus benign biopsy, normal appearing gastric mucosa, biopsy with chronic gastritis with H pylori. Small bowel looked normal, biopsy negative for celiac. Terminal ileum was normal, colon normal. next TCS 2018.   Marland Kitchen COLONOSCOPY WITH PROPOFOL N/A 01/08/2017   Procedure: COLONOSCOPY WITH PROPOFOL;  Surgeon: Danie Binder, MD;  Location: [facility];  Service: Endoscopy;  Laterality: N/A;  8:45AM  . ESOPHAGOGASTRODUODENOSCOPY (EGD) WITH PROPOFOL N/A 01/08/2017   Procedure: ESOPHAGOGASTRODUODENOSCOPY (EGD) WITH PROPOFOL;  Surgeon: Danie Binder, MD;  [facility];  Service: Endoscopy;  Laterality: N/A;  . GIVENS CAPSULE STUDY N/A 01/31/2017   Procedure: GIVENS CAPSULE STUDY;  Surgeon: Danie Binder, MD;  Location: [facility];  Service: Endoscopy;  Laterality: N/A;  7:30am, patient to arrive at 7:00am  . iron infusion      . POLYPECTOMY  01/08/2017   Procedure: POLYPECTOMY;  Surgeon: Danie Binder, MD;  Location[facility];  Service: Endoscopy;;  distal transverse colon polyp  . TUBAL LIGATION      Family History Family History  Problem Relation Age of Onset  . Diabetes Mother   . Hypertension  Father   . Breast cancer Maternal Grandmother   . Colon cancer Neg Hx      Social History  reports that she has never smoked. She has never used smokeless tobacco. She reports that she does not drink alcohol or use drugs.  Medications  Current Outpatient Medications:  .  acetaminophen (TYLENOL) 500 MG tablet, Take 1,000 mg by mouth every 6 (six) hours as needed (for pain/headache)., Disp: , Rfl:  .  albuterol (PROVENTIL HFA;VENTOLIN HFA) 108 (90 Base) MCG/ACT inhaler, Inhale 2 puffs into the  lungs every 6 (six) hours as needed for wheezing or shortness of breath., Disp: , Rfl:  .  benazepril (LOTENSIN) 20 MG tablet, Take 20 mg by mouth daily., Disp: , Rfl: 1 .  Calcium Carb-Cholecalciferol (CALCIUM/VITAMIN D PO), Take 1 tablet by mouth 2 (two) times daily. 1200-800, Disp: , Rfl:  .  cetirizine (ZYRTEC) 10 MG tablet, Take 10 mg by mouth daily. , Disp: , Rfl:  .  cholecalciferol (VITAMIN D) 1000 units tablet, Take 1,000 Units by mouth daily., Disp: , Rfl:  .  fluticasone (FLONASE) 50 MCG/ACT nasal spray, Place 1-2 sprays into both nostrils daily as needed (for allergies). , Disp: , Rfl: 0 .  hydrochlorothiazide (HYDRODIURIL) 25 MG tablet, Take 25 mg by mouth daily., Disp: , Rfl: 0 .  ibuprofen (ADVIL,MOTRIN) 800 MG tablet, Take 800 mg by mouth every 8 (eight) hours as needed for mild pain or moderate pain. , Disp: , Rfl: 0 .  levothyroxine (SYNTHROID, LEVOTHROID) 75 MCG tablet, Take 75 mcg by mouth daily before breakfast. , Disp: , Rfl: 0 .  medroxyPROGESTERone (PROVERA) 5 MG tablet, TAKE ONE TABLET BY MOUTH DAILY FOR 2 WEEKS. REPEAT EVERY 3 MONTHS AND RECORD MENSTRUAL PERIOD, Disp: 14 tablet, Rfl: 5 .  miconazole (MICATIN) 2 % cream, Apply 1 application topically 2 (two) times daily. (Patient not taking: Reported on 04/01/2018), Disp: 28.35 g, Rfl: prn .  pravastatin (PRAVACHOL) 20 MG tablet, Take 20 mg by mouth at bedtime. , Disp: , Rfl: 0 .  ranitidine (ZANTAC) 150 MG capsule, Take 150 mg by mouth daily before supper. , Disp: , Rfl:   Allergies Penicillins  Review of Systems Review of Systems - Oncology ROS as per HPI otherwise 12 point ROS is negative.   Physical Exam  Vitals Wt Readings from Last 3 Encounters:  04/08/18 258 lb (117 kg)  03/21/18 258 lb (117 kg)  01/30/18 267 lb (121.1 kg)   Temp Readings from Last 3 Encounters:  04/08/18 98.4 F (36.9 C) (Oral)  10/11/17 97.6 F (36.4 C) (Oral)  10/08/17 98.7 F (37.1 C) (Oral)   BP Readings from Last 3  Encounters:  04/08/18 133/82  03/21/18 130/84  01/30/18 110/80   Pulse Readings from Last 3 Encounters:  04/08/18 79  03/21/18 (!) 107  01/30/18 95   Constitutional: Well-developed, well-nourished, and in no distress.   HENT: Head: Normocephalic and atraumatic.  Mouth/Throat: No oropharyngeal exudate. Mucosa moist. Eyes: Pupils are equal, round, and reactive to light. Conjunctivae are normal. No scleral icterus.  Neck: Normal range of motion. Neck supple. No JVD present.  Cardiovascular: Normal rate, regular rhythm and normal heart sounds.  Exam reveals no gallop and no friction rub.   No murmur heard. Pulmonary/Chest: Effort normal and breath sounds normal. No respiratory distress. No wheezes.No rales.  Abdominal: Soft. Bowel sounds are normal. No distension. There is no tenderness. There is no guarding.  Musculoskeletal: No edema or tenderness.  Lymphadenopathy: No cervical,  axillary or supraclavicular adenopathy.  Neurological: Alert and oriented to person, place, and time. No cranial nerve deficit.  Skin: Skin is warm and dry. No rash noted. No erythema. No pallor.  Psychiatric: Affect and judgment normal.   Labs No visits with results within 3 Day(s) from this visit.  Latest known visit with results is:  Appointment on 04/01/2018  Component Date Value Ref Range Status  . WBC 04/01/2018 7.7  4.0 - 10.5 K/uL Final  . RBC 04/01/2018 5.20* 3.87 - 5.11 MIL/uL Final  . Hemoglobin 04/01/2018 14.5  12.0 - 15.0 g/dL Final  . HCT 04/01/2018 44.3  36.0 - 46.0 % Final  . MCV 04/01/2018 85.2  78.0 - 100.0 fL Final  . MCH 04/01/2018 27.9  26.0 - 34.0 pg Final  . MCHC 04/01/2018 32.7  30.0 - 36.0 g/dL Final  . RDW 04/01/2018 12.9  11.5 - 15.5 % Final  . Platelets 04/01/2018 292  150 - 400 K/uL Final  . Neutrophils Relative % 04/01/2018 52  % Final  . Neutro Abs 04/01/2018 4.1  1.7 - 7.7 K/uL Final  . Lymphocytes Relative 04/01/2018 37  % Final  . Lymphs Abs 04/01/2018 2.9  0.7 - 4.0  K/uL Final  . Monocytes Relative 04/01/2018 5  % Final  . Monocytes Absolute 04/01/2018 0.4  0.1 - 1.0 K/uL Final  . Eosinophils Relative 04/01/2018 5  % Final  . Eosinophils Absolute 04/01/2018 0.4  0.0 - 0.7 K/uL Final  . Basophils Relative 04/01/2018 1  % Final  . Basophils Absolute 04/01/2018 0.0  0.0 - 0.1 K/uL Final   Performed at [facility], [address], [place], [place] [address]  . Sodium 04/01/2018 134* 135 - 145 mmol/L Final  . Potassium 04/01/2018 3.4* 3.5 - 5.1 mmol/L Final  . Chloride 04/01/2018 96* 101 - 111 mmol/L Final  . CO2 04/01/2018 29  22 - 32 mmol/L Final  . Glucose, Bld 04/01/2018 95  65 - 99 mg/dL Final  . BUN 04/01/2018 16  6 - 20 mg/dL Final  . Creatinine, Ser 04/01/2018 0.63  0.44 - 1.00 mg/dL Final  . Calcium 04/01/2018 9.0  8.9 - 10.3 mg/dL Final  . Total Protein 04/01/2018 7.0  6.5 - 8.1 g/dL Final  . Albumin 04/01/2018 3.9  3.5 - 5.0 g/dL Final  . AST 04/01/2018 19  15 - 41 U/L Final  . ALT 04/01/2018 21  14 - 54 U/L Final  . Alkaline Phosphatase 04/01/2018 56  38 - 126 U/L Final  . Total Bilirubin 04/01/2018 0.9  0.3 - 1.2 mg/dL Final  . GFR calc non Af Amer 04/01/2018 >60  >60 mL/min Final  . GFR calc Af Amer 04/01/2018 >60  >60 mL/min Final   Comment: (NOTE) The eGFR has been calculated using the CKD EPI equation. This calculation has not been validated in all clinical situations. eGFR's persistently <60 mL/min signify possible Chronic Kidney Disease.   Georgiann Hahn gap 04/01/2018 9  5 - 15 Final   Performed at [facility], [address], [place], [place] [address]  . Iron 04/01/2018 50  28 - 170 ug/dL Final  . TIBC 04/01/2018 335  250 - 450 ug/dL Final  . Saturation Ratios 04/01/2018 15  10.4 - 31.8 % Final  . UIBC 04/01/2018 285  ug/dL Final   Performed at [facility], [place] [address] [place], [place] [address]  . Ferritin 04/01/2018 45  11 - 307 ng/mL Final   Performed at [facility], [place] [address], [place], [place] [address]      Pathology Orders Placed This Encounter  Procedures  . CBC with Differential/Platelet    Standing Status:   Future    Standing Expiration Date:   04/09/2019  . Comprehensive metabolic panel    Standing Status:   Future    Standing Expiration Date:   04/09/2019  . Lactate dehydrogenase    Standing Status:   Future    Standing Expiration Date:   04/09/2019  . Ferritin    Standing Status:   Future    Standing Expiration Date:   04/09/2019       Zoila Shutter MD

## 2018-04-08 NOTE — Progress Notes (Unsigned)
Preoperative History and Physical  Christine Foley is a 57 y.o. G2P2 here for surgical management of postmenopausal bleeding associated with an previous endometrial polyp that was partially removed when it was found to be prolapsing through the cervix we spoke recently and she is having continued intermittent spotting.  Ultrasound suggested that the endometrium was somewhat thickened on a poor quality ultrasound.  We will do a hysteroscopy D&C to remove what cyst suspected to be endometrial polyp remnant.   No significant preoperative concerns.  Proposed surgery: 1. Hysteroscopy D&C with removal of endometrial polyp 2.  Removal of multiple skin tags, chest wall and thighs.      Past Medical History:  Diagnosis Date  . Anemia   . Arthritis   . Asthma   . GERD (gastroesophageal reflux disease)   . Hyperlipidemia   . Hypertension   . Hypothyroidism   . 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 Sleep apnea    did not tolerate machine  . Thyroid disease         Past Surgical History:  Procedure Laterality Date  . BIOPSY  01/08/2017   Procedure: BIOPSY;  Surgeon: Danie Binder, MD;  [facility];  Service: Endoscopy;;  cecal polyps x2, ascending colon polyp, duodenal bx's , gastric bx's , gastric polyps bx  . carpel tunnel     right  . CESAREAN SECTION     x 2  . COLONOSCOPY WITH ESOPHAGOGASTRODUODENOSCOPY (EGD)  2008   Dr. Oneida Alar: Sessile polypoid appearing lesion in the midesophagus benign biopsy, normal appearing gastric mucosa, biopsy with chronic gastritis with H pylori. Small bowel looked normal, biopsy negative for celiac. Terminal ileum was normal, colon normal. next TCS 2018.   Marland Kitchen COLONOSCOPY WITH PROPOFOL N/A 01/08/2017   Procedure: COLONOSCOPY WITH PROPOFOL;  Surgeon: Danie Binder, MD;  [facility];  Service: Endoscopy;  Laterality: N/A;  8:45AM  . ESOPHAGOGASTRODUODENOSCOPY (EGD) WITH PROPOFOL N/A 01/08/2017   Procedure:  ESOPHAGOGASTRODUODENOSCOPY (EGD) WITH PROPOFOL;  Surgeon: Danie Binder, MD;  [facility];  Service: Endoscopy;  Laterality: N/A;  . GIVENS CAPSULE STUDY N/A 01/31/2017   Procedure: GIVENS CAPSULE STUDY;  Surgeon: Danie Binder, MD;  [facility];  Service: Endoscopy;  Laterality: N/A;  7:30am, patient to arrive at 7:00am  . iron infusion      . POLYPECTOMY  01/08/2017   Procedure: POLYPECTOMY;  Surgeon: Danie Binder, MD;  [facility];  Service: Endoscopy;;  distal transverse colon polyp  . TUBAL LIGATION                     OB History  Gravida Para Term Preterm AB Living  2 2       2   SAB TAB Ectopic Multiple Live Births                     # Outcome Date GA Lbr Len/2nd Weight Sex Delivery Anes PTL Lv  2 Para           1 Para           Patient denies any other pertinent gynecologic issues.   Current Outpatient Medications on File Prior to Visit  Medication Sig Dispense Refill  . acetaminophen (TYLENOL) 500 MG tablet Take 1,000 mg by mouth every 6 (six) hours as needed (for pain/headache).    . benazepril (LOTENSIN) 20 MG tablet Take 20 mg by mouth daily.  1  .  Calcium Carb-Cholecalciferol (CALCIUM/VITAMIN D PO) Take 1 tablet by mouth 2 (two) times daily. 1200-800    . cetirizine (ZYRTEC) 10 MG tablet Take 10 mg by mouth at bedtime.     . cholecalciferol (VITAMIN D) 1000 units tablet Take 1,000 Units by mouth daily.    . fluticasone (FLONASE) 50 MCG/ACT nasal spray Place 1-2 sprays into both nostrils daily as needed (for allergies).   0  . hydrochlorothiazide (HYDRODIURIL) 25 MG tablet Take 25 mg by mouth daily.  0  . ibuprofen (ADVIL,MOTRIN) 800 MG tablet   0  . levothyroxine (SYNTHROID, LEVOTHROID) 75 MCG tablet Take 75 mcg by mouth daily before breakfast.   0  . medroxyPROGESTERone (PROVERA) 5 MG tablet TAKE ONE TABLET BY MOUTH DAILY FOR 2 WEEKS. REPEAT EVERY 3 MONTHS AND RECORD MENSTRUAL PERIOD 14 tablet  5  . pravastatin (PRAVACHOL) 20 MG tablet Take 20 mg by mouth at bedtime.   0  . ranitidine (ZANTAC) 150 MG capsule Take 150 mg by mouth daily before supper.     . Potassium Chloride ER 20 MEQ TBCR 1 PO BID FOR 4 DAYS (Patient not taking: Reported on 01/30/2018) 8 tablet 0   No current facility-administered medications on file prior to visit.         Allergies  Allergen Reactions  . Penicillins Hives, Shortness Of Breath and Rash    Has patient had a PCN reaction causing immediate rash, facial/tongue/throat swelling, SOB or lightheadedness with hypotension:Yes Has patient had a PCN reaction causing severe rash involving mucus membranes or skin necrosis:unsure Has patient had a PCN reaction that required hospitalization:Treated in ER Has patient had a PCN reaction occurring within the last 10 years:unsure If all of the above answers are "NO", then may proceed with Cephalosporin use.     Social History:   reports that she has never smoked. She has never used smokeless tobacco. She reports that she does not drink alcohol or use drugs.       Family History  Problem Relation Age of Onset  . Diabetes Mother   . Hypertension Father   . Breast cancer Maternal Grandmother   . Colon cancer Neg Hx     Review of Systems: Noncontributory  PHYSICAL EXAM: Blood pressure 130/84, pulse (!) 107, height 4\' 11"  (1.499 m), weight 258 lb (117 kg). General appearance - alert, well appearing, and in no distress Chest - clear to auscultation, no wheezes, rales or rhonchi, symmetric air entry Heart - normal rate and regular rhythm Abdomen - soft, nontender, nondistended, no masses or organomegaly                     Well-healed C-section scars, exam limited by patient obesity Pelvic - examination significant vaginal length long narrow speculum required nulliparous cervix uterus anteflexedx Extremities - peripheral pulses normal, no pedal edema, no clubbing or  cyanosis  Labs:  Imaging Studies: ImagingResults  No results found.    Assessment: Postmenopausal bleeding due to endometrial polyp remnant 2.  Multiple skin tags in her thighs and under breast we will remove Tinea corporis under her breasts bilaterally, prescribe miconazole Exogenous obesity Body mass index is 52.11 kg/m.        Patient Active Problem List   Diagnosis Date Noted  . Iron deficiency anemia   . GERD (gastroesophageal reflux disease) 12/24/2016  . 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 Iron deficiency 10/09/2016  . Abnormal perimenopausal bleeding 04/12/2015  Regimens obesity  Plan: Patient will undergo surgical management  with 1.  Hysteroscopy D&C with removal of endometrial polyp 2 removal of multiple skin tags inner thigh and under breast 3.  Treated with miconazole for tinea corporis under the breast.         Current Outpatient Medications on File Prior to Visit  Medication Sig Dispense Refill  . acetaminophen (TYLENOL) 500 MG tablet Take 1,000 mg by mouth every 6 (six) hours as needed (for pain/headache).    . benazepril (LOTENSIN) 20 MG tablet Take 20 mg by mouth daily.  1  . Calcium Carb-Cholecalciferol (CALCIUM/VITAMIN D PO) Take 1 tablet by mouth 2 (two) times daily. 1200-800    . cetirizine (ZYRTEC) 10 MG tablet Take 10 mg by mouth at bedtime.     . cholecalciferol (VITAMIN D) 1000 units tablet Take 1,000 Units by mouth daily.    . fluticasone (FLONASE) 50 MCG/ACT nasal spray Place 1-2 sprays into both nostrils daily as needed (for allergies).   0  . hydrochlorothiazide (HYDRODIURIL) 25 MG tablet Take 25 mg by mouth daily.  0  . ibuprofen (ADVIL,MOTRIN) 800 MG tablet   0  . levothyroxine (SYNTHROID, LEVOTHROID) 75 MCG tablet Take 75 mcg by mouth daily before breakfast.   0  . medroxyPROGESTERone (PROVERA) 5 MG tablet TAKE ONE TABLET BY MOUTH DAILY FOR 2 WEEKS. REPEAT EVERY 3 MONTHS AND RECORD MENSTRUAL PERIOD 14 tablet 5  .  pravastatin (PRAVACHOL) 20 MG tablet Take 20 mg by mouth at bedtime.   0  . ranitidine (ZANTAC) 150 MG capsule Take 150 mg by mouth daily before supper.     . Potassium Chloride ER 20 MEQ TBCR 1 PO BID FOR 4 DAYS (Patient not taking: Reported on 01/30/2018) 8 tablet 0   No current facility-administered medications on file prior to visit.    Jonnie Kind, MD  . 03/21/2018 10:39 AM

## 2018-04-08 NOTE — Patient Instructions (Signed)
[facility]  Discharge Instructions:  You were seen by the oncologist today.  _______________________________________________________________  Thank you for choosing [facility] to provide your oncology and hematology care.  To afford each patient quality time with our providers, please arrive at least 15 minutes before your scheduled appointment.  You need to re-schedule your appointment if you arrive 10 or more minutes late.  We strive to give you quality time with our providers, and arriving late affects you and other patients whose appointments are after yours.  Also, if you no show three or more times for appointments you may be dismissed from the clinic.  Again, thank you for choosing [facility] hope is that these requests will allow you access to exceptional care and in a timely manner. _______________________________________________________________  If you have questions after your visit, please contact our office at [phone] between the hours of 8:30 a.m. and 5:00 p.m. Voicemails left after 4:30 p.m. will not be returned until the following business day. _______________________________________________________________  For prescription refill requests, have your pharmacy contact our office. _______________________________________________________________  Recommendations made by the consultant and any test results will be sent to your referring physician. _______________________________________________________________

## 2018-04-09 ENCOUNTER — Other Ambulatory Visit: Payer: Self-pay

## 2018-04-09 ENCOUNTER — Encounter (HOSPITAL_COMMUNITY)
Admission: RE | Admit: 2018-04-09 | Discharge: 2018-04-09 | Disposition: A | Discharge status: Home / Self Care | Payer: BLUE CROSS/BLUE SHIELD | Source: Ambulatory Visit | Attending: Obstetrics and Gynecology | Admitting: Obstetrics and Gynecology

## 2018-04-09 ENCOUNTER — Encounter (HOSPITAL_COMMUNITY): Payer: Self-pay

## 2018-04-09 DIAGNOSIS — R9431 Abnormal electrocardiogram [ECG] [EKG]: Secondary | ICD-10-CM | POA: Insufficient documentation

## 2018-04-09 DIAGNOSIS — Z01810 Encounter for preprocedural cardiovascular examination: Secondary | ICD-10-CM | POA: Insufficient documentation

## 2018-04-09 DIAGNOSIS — Z01812 Encounter for preprocedural laboratory examination: Secondary | ICD-10-CM | POA: Diagnosis not present

## 2018-04-09 LAB — HCG, SERUM, QUALITATIVE: Preg, Serum: NEGATIVE

## 2018-04-09 LAB — COMPREHENSIVE METABOLIC PANEL
ALBUMIN: 4.1 g/dL (ref 3.5–5.0)
ALK PHOS: 58 U/L (ref 38–126)
ALT: 21 U/L (ref 14–54)
ANION GAP: 8 (ref 5–15)
AST: 18 U/L (ref 15–41)
BUN: 14 mg/dL (ref 6–20)
CALCIUM: 9.4 mg/dL (ref 8.9–10.3)
CO2: 31 mmol/L (ref 22–32)
Chloride: 100 mmol/L — ABNORMAL LOW (ref 101–111)
Creatinine, Ser: 0.67 mg/dL (ref 0.44–1.00)
GFR calc Af Amer: >60 mL/min (ref >60)
GFR calc non Af Amer: >60 mL/min (ref >60)
GLUCOSE: 100 mg/dL — AB (ref 65–99)
Potassium: 3.2 mmol/L — ABNORMAL LOW (ref 3.5–5.1)
SODIUM: 139 mmol/L (ref 135–145)
Total Bilirubin: 0.9 mg/dL (ref 0.3–1.2)
Total Protein: 7.2 g/dL (ref 6.5–8.1)

## 2018-04-09 LAB — CBC
HCT: 47.7 % — ABNORMAL HIGH (ref 36.0–46.0)
HEMOGLOBIN: 15.6 g/dL — AB (ref 12.0–15.0)
MCH: 28.2 pg (ref 26.0–34.0)
MCHC: 32.7 g/dL (ref 30.0–36.0)
MCV: 86.1 fL (ref 78.0–100.0)
Platelets: 299 10*3/uL (ref 150–400)
RBC: 5.54 MIL/uL — ABNORMAL HIGH (ref 3.87–5.11)
RDW: 13.2 % (ref 11.5–15.5)
WBC: 6.4 10*3/uL (ref 4.0–10.5)

## 2018-04-09 NOTE — Pre-Procedure Instructions (Signed)
CMet routed and called to Dr Johnnye Sima office.

## 2018-04-14 NOTE — H&P (Addendum)
Expand All Collapse All   Preoperative History and Physical  Christine Foley is a 57 y.o. G2P2 here for surgical management of postmenopausal bleeding associated with an previous endometrial polyp that was partially removed when it was found to be prolapsing through the cervix we spoke recently and she is having continued intermittent spotting.  Ultrasound suggested that the endometrium was somewhat thickened on a poor quality ultrasound.  We will do a hysteroscopy D&C to remove what cyst suspected to be endometrial polyp remnant.   No significant preoperative concerns.  Proposed surgery: Hysteroscopy D&C with removal of endometrial polyp                                  Also: "removal of multiple skin tags, chest wall and thighs bilateral.     Past Medical History:  Diagnosis Date  . Anemia   . Arthritis   . Asthma   . GERD (gastroesophageal reflux disease)   . Hyperlipidemia   . Hypertension   . Hypothyroidism   . 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 Sleep apnea    did not tolerate machine  . Thyroid disease         Past Surgical History:  Procedure Laterality Date  . BIOPSY  01/08/2017   Procedure: BIOPSY;  Surgeon: Danie Binder, MD;  [facility];  Service: Endoscopy;;  cecal polyps x2, ascending colon polyp, duodenal bx's , gastric bx's , gastric polyps bx  . carpel tunnel     right  . CESAREAN SECTION     x 2  . COLONOSCOPY WITH ESOPHAGOGASTRODUODENOSCOPY (EGD)  2008   Dr. Oneida Alar: Sessile polypoid appearing lesion in the midesophagus benign biopsy, normal appearing gastric mucosa, biopsy with chronic gastritis with H pylori. Small bowel looked normal, biopsy negative for celiac. Terminal ileum was normal, colon normal. next TCS 2018.   Marland Kitchen COLONOSCOPY WITH PROPOFOL N/A 01/08/2017   Procedure: COLONOSCOPY WITH PROPOFOL;  Surgeon: Danie Binder, MD;  [facility];  Service: Endoscopy;  Laterality: N/A;  8:45AM  .  ESOPHAGOGASTRODUODENOSCOPY (EGD) WITH PROPOFOL N/A 01/08/2017   Procedure: ESOPHAGOGASTRODUODENOSCOPY (EGD) WITH PROPOFOL;  Surgeon: Danie Binder, MD;  [facility];  Service: Endoscopy;  Laterality: N/A;  . GIVENS CAPSULE STUDY N/A 01/31/2017   Procedure: GIVENS CAPSULE STUDY;  Surgeon: Danie Binder, MD;  [facility];  Service: Endoscopy;  Laterality: N/A;  7:30am, patient to arrive at 7:00am  . iron infusion      . POLYPECTOMY  01/08/2017   Procedure: POLYPECTOMY;  Surgeon: Danie Binder, MD;  [facility];  Service: Endoscopy;;  distal transverse colon polyp  . TUBAL LIGATION                     OB History  Gravida Para Term Preterm AB Living  2 2       2   SAB TAB Ectopic Multiple Live Births                     # Outcome Date GA Lbr Len/2nd Weight Sex Delivery Anes PTL Lv  2 Para           1 Para           Patient denies any other pertinent gynecologic issues.         Current Outpatient Medications on File Prior to Visit  Medication Sig Dispense Refill  . acetaminophen (TYLENOL) 500 MG tablet Take 1,000 mg by mouth every 6 (six) hours as needed (for pain/headache).    . benazepril (LOTENSIN) 20 MG tablet Take 20 mg by mouth daily.  1  . Calcium Carb-Cholecalciferol (CALCIUM/VITAMIN D PO) Take 1 tablet by mouth 2 (two) times daily. 1200-800    . cetirizine (ZYRTEC) 10 MG tablet Take 10 mg by mouth at bedtime.     . cholecalciferol (VITAMIN D) 1000 units tablet Take 1,000 Units by mouth daily.    . fluticasone (FLONASE) 50 MCG/ACT nasal spray Place 1-2 sprays into both nostrils daily as needed (for allergies).   0  . hydrochlorothiazide (HYDRODIURIL) 25 MG tablet Take 25 mg by mouth daily.  0  . ibuprofen (ADVIL,MOTRIN) 800 MG tablet   0  . levothyroxine (SYNTHROID, LEVOTHROID) 75 MCG tablet Take 75 mcg by mouth daily before breakfast.   0  . medroxyPROGESTERone (PROVERA) 5 MG tablet TAKE ONE TABLET BY  MOUTH DAILY FOR 2 WEEKS. REPEAT EVERY 3 MONTHS AND RECORD MENSTRUAL PERIOD 14 tablet 5  . pravastatin (PRAVACHOL) 20 MG tablet Take 20 mg by mouth at bedtime.   0  . ranitidine (ZANTAC) 150 MG capsule Take 150 mg by mouth daily before supper.     . Potassium Chloride ER 20 MEQ TBCR 1 PO BID FOR 4 DAYS (Patient not taking: Reported on 01/30/2018) 8 tablet 0   No current facility-administered medications on file prior to visit.         Allergies  Allergen Reactions  . Penicillins Hives, Shortness Of Breath and Rash    Has patient had a PCN reaction causing immediate rash, facial/tongue/throat swelling, SOB or lightheadedness with hypotension:Yes Has patient had a PCN reaction causing severe rash involving mucus membranes or skin necrosis:unsure Has patient had a PCN reaction that required hospitalization:Treated in ER Has patient had a PCN reaction occurring within the last 10 years:unsure If all of the above answers are "NO", then may proceed with Cephalosporin use.     Social History:   reports that she has never smoked. She has never used smokeless tobacco. She reports that she does not drink alcohol or use drugs.  Family History  Problem Relation Age of Onset  . Diabetes Mother   . Hypertension Father   . Breast cancer Maternal Grandmother   . Colon cancer Neg Hx     Review of Systems: Noncontributory  PHYSICAL EXAM: Blood pressure 130/84, pulse (!) 107, height 4\' 11"  (1.499 m), weight 258 lb (117 kg). General appearance - alert, well appearing, and in no distress Chest - clear to auscultation, no wheezes, rales or rhonchi, symmetric air entry Heart - normal rate and regular rhythm Abdomen - soft, nontender, nondistended, no masses or organomegaly                     Well-healed C-section scars, exam limited by patient obesity Pelvic - examination significant vaginal length long narrow speculum required nulliparous cervix uterus anteflexedx Extremities -  peripheral pulses normal, no pedal edema, no clubbing or cyanosis  Labs: CBC    Component Value Date/Time   WBC 6.4 04/09/2018 0907   RBC 5.54 (H) 04/09/2018 0907   HGB 15.6 (H) 04/09/2018 0907   HCT 47.7 (H) 04/09/2018 0907   PLT 299 04/09/2018 0907   MCV 86.1 04/09/2018 0907   MCH 28.2 04/09/2018 0907   MCHC 32.7 04/09/2018 0907   RDW 13.2 04/09/2018 0907  LYMPHSABS 2.9 04/01/2018 1351   MONOABS 0.4 04/01/2018 1351   EOSABS 0.4 04/01/2018 1351   BASOSABS 0.0 04/01/2018 1351   CMP     Component Value Date/Time   NA 139 04/09/2018 0907   K 3.2 (L) 04/09/2018 0907   CL 100 (L) 04/09/2018 0907   CO2 31 04/09/2018 0907   GLUCOSE 100 (H) 04/09/2018 0907   BUN 14 04/09/2018 0907   CREATININE 0.67 04/09/2018 0907   CALCIUM 9.4 04/09/2018 0907   PROT 7.2 04/09/2018 0907   ALBUMIN 4.1 04/09/2018 0907   AST 18 04/09/2018 0907   ALT 21 04/09/2018 0907   ALKPHOS 58 04/09/2018 0907   BILITOT 0.9 04/09/2018 0907   GFRNONAA >60 04/09/2018 0907   GFRAA >60 04/09/2018 0907     Imaging Studies: ImagingResults  No results found.    Assessment: Postmenopausal bleeding due to endometrial polyp remnant 2.  Multiple skin tags in her thighs and under breast we will remove Tinea corporis under her breasts bilaterally, prescribe miconazole Exogenous obesity Body mass index is 52.11 kg/m.        Patient Active Problem List   Diagnosis Date Noted  . Iron deficiency anemia   . GERD (gastroesophageal reflux disease) 12/24/2016  . 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 Iron deficiency 10/09/2016  . Abnormal perimenopausal bleeding 04/12/2015  Regimens obesity  Plan: Patient will undergo surgical management with 1.  Hysteroscopy D&C with removal of endometrial polyp 2 removal of multiple skin tags inner thigh and under breast 3.  Treated with miconazole for tinea corporis under the breast.         Current Outpatient Medications on File Prior to Visit  Medication  Sig Dispense Refill  . acetaminophen (TYLENOL) 500 MG tablet Take 1,000 mg by mouth every 6 (six) hours as needed (for pain/headache).    . benazepril (LOTENSIN) 20 MG tablet Take 20 mg by mouth daily.  1  . Calcium Carb-Cholecalciferol (CALCIUM/VITAMIN D PO) Take 1 tablet by mouth 2 (two) times daily. 1200-800    . cetirizine (ZYRTEC) 10 MG tablet Take 10 mg by mouth at bedtime.     . cholecalciferol (VITAMIN D) 1000 units tablet Take 1,000 Units by mouth daily.    . fluticasone (FLONASE) 50 MCG/ACT nasal spray Place 1-2 sprays into both nostrils daily as needed (for allergies).   0  . hydrochlorothiazide (HYDRODIURIL) 25 MG tablet Take 25 mg by mouth daily.  0  . ibuprofen (ADVIL,MOTRIN) 800 MG tablet   0  . levothyroxine (SYNTHROID, LEVOTHROID) 75 MCG tablet Take 75 mcg by mouth daily before breakfast.   0  . medroxyPROGESTERone (PROVERA) 5 MG tablet TAKE ONE TABLET BY MOUTH DAILY FOR 2 WEEKS. REPEAT EVERY 3 MONTHS AND RECORD MENSTRUAL PERIOD 14 tablet 5  . pravastatin (PRAVACHOL) 20 MG tablet Take 20 mg by mouth at bedtime.   0  . ranitidine (ZANTAC) 150 MG capsule Take 150 mg by mouth daily before supper.     . Potassium Chloride ER 20 MEQ TBCR 1 PO BID FOR 4 DAYS (Patient not taking: Reported on 01/30/2018) 8 tablet 0   No current facility-administered medications on file prior to visit.    Jonnie Kind, MD  .mec 03/21/2018 10:39 AM

## 2018-04-15 ENCOUNTER — Encounter (HOSPITAL_COMMUNITY): Payer: Self-pay | Admitting: *Deleted

## 2018-04-15 ENCOUNTER — Ambulatory Visit (HOSPITAL_COMMUNITY): Payer: BLUE CROSS/BLUE SHIELD | Admitting: Anesthesiology

## 2018-04-15 ENCOUNTER — Other Ambulatory Visit: Payer: Self-pay

## 2018-04-15 ENCOUNTER — Encounter (HOSPITAL_COMMUNITY)
Admission: RE | Disposition: A | Discharge status: Home / Self Care | Payer: Self-pay | Source: Ambulatory Visit | Attending: Obstetrics and Gynecology

## 2018-04-15 ENCOUNTER — Observation Stay (HOSPITAL_COMMUNITY)
Admission: RE | Admit: 2018-04-15 | Discharge: 2018-04-15 | Disposition: A | Discharge status: Home / Self Care | Payer: BLUE CROSS/BLUE SHIELD | Source: Ambulatory Visit | Attending: Obstetrics and Gynecology | Admitting: Obstetrics and Gynecology

## 2018-04-15 DIAGNOSIS — G4730 Sleep apnea, unspecified: Secondary | ICD-10-CM | POA: Diagnosis present

## 2018-04-15 DIAGNOSIS — R001 Bradycardia, unspecified: Secondary | ICD-10-CM | POA: Diagnosis not present

## 2018-04-15 DIAGNOSIS — I498 Other specified cardiac arrhythmias: Secondary | ICD-10-CM | POA: Diagnosis not present

## 2018-04-15 DIAGNOSIS — K219 Gastro-esophageal reflux disease without esophagitis: Secondary | ICD-10-CM | POA: Insufficient documentation

## 2018-04-15 DIAGNOSIS — Z79899 Other long term (current) drug therapy: Secondary | ICD-10-CM | POA: Diagnosis not present

## 2018-04-15 DIAGNOSIS — E039 Hypothyroidism, unspecified: Secondary | ICD-10-CM | POA: Insufficient documentation

## 2018-04-15 DIAGNOSIS — Z9889 Other specified postprocedural states: Secondary | ICD-10-CM | POA: Diagnosis not present

## 2018-04-15 DIAGNOSIS — Z79890 Hormone replacement therapy (postmenopausal): Secondary | ICD-10-CM | POA: Diagnosis not present

## 2018-04-15 DIAGNOSIS — J45909 Unspecified asthma, uncomplicated: Secondary | ICD-10-CM | POA: Insufficient documentation

## 2018-04-15 DIAGNOSIS — E785 Hyperlipidemia, unspecified: Secondary | ICD-10-CM | POA: Insufficient documentation

## 2018-04-15 DIAGNOSIS — Z880 Allergy status to penicillin: Secondary | ICD-10-CM | POA: Insufficient documentation

## 2018-04-15 DIAGNOSIS — N950 Postmenopausal bleeding: Secondary | ICD-10-CM | POA: Diagnosis not present

## 2018-04-15 DIAGNOSIS — L918 Other hypertrophic disorders of the skin: Secondary | ICD-10-CM | POA: Diagnosis not present

## 2018-04-15 DIAGNOSIS — Z6841 Body Mass Index (BMI) 40.0 and over, adult: Secondary | ICD-10-CM | POA: Diagnosis not present

## 2018-04-15 DIAGNOSIS — I455 Other specified heart block: Secondary | ICD-10-CM

## 2018-04-15 DIAGNOSIS — Z789 Other specified health status: Secondary | ICD-10-CM | POA: Diagnosis not present

## 2018-04-15 DIAGNOSIS — Z8249 Family history of ischemic heart disease and other diseases of the circulatory system: Secondary | ICD-10-CM | POA: Diagnosis not present

## 2018-04-15 DIAGNOSIS — G4733 Obstructive sleep apnea (adult) (pediatric): Secondary | ICD-10-CM | POA: Diagnosis not present

## 2018-04-15 DIAGNOSIS — I10 Essential (primary) hypertension: Secondary | ICD-10-CM

## 2018-04-15 DIAGNOSIS — N840 Polyp of corpus uteri: Secondary | ICD-10-CM | POA: Insufficient documentation

## 2018-04-15 DIAGNOSIS — Z7951 Long term (current) use of inhaled steroids: Secondary | ICD-10-CM | POA: Insufficient documentation

## 2018-04-15 HISTORY — PX: HYSTEROSCOPY W/D&C: SHX1775

## 2018-04-15 HISTORY — PX: POLYPECTOMY: SHX5525

## 2018-04-15 HISTORY — PX: EXCISION OF SKIN TAG: SHX6270

## 2018-04-15 LAB — MRSA PCR SCREENING: MRSA by PCR: NEGATIVE

## 2018-04-15 SURGERY — DILATATION AND CURETTAGE /HYSTEROSCOPY
Anesthesia: General

## 2018-04-15 MED ORDER — ONDANSETRON HCL 4 MG/2ML IJ SOLN
INTRAMUSCULAR | Status: AC
  Filled 2018-04-15: qty 2

## 2018-04-15 MED ORDER — MIDAZOLAM HCL 2 MG/2ML IJ SOLN
INTRAMUSCULAR | Status: AC
  Filled 2018-04-15: qty 2

## 2018-04-15 MED ORDER — IBUPROFEN 800 MG PO TABS: 800.0000 mg | ORAL_TABLET | Freq: Three times a day (TID) | ORAL | Status: DC | PRN

## 2018-04-15 MED ORDER — LACTATED RINGERS IV SOLN: INTRAVENOUS | Status: DC

## 2018-04-15 MED ORDER — MIDAZOLAM HCL 5 MG/5ML IJ SOLN
INTRAMUSCULAR | Status: DC | PRN
  Administered 2018-04-15: 2 mg via INTRAVENOUS

## 2018-04-15 MED ORDER — FENTANYL CITRATE (PF) 100 MCG/2ML IJ SOLN
INTRAMUSCULAR | Status: AC
  Filled 2018-04-15: qty 2

## 2018-04-15 MED ORDER — VITAMIN D 1000 UNITS PO TABS
1000.0000 [IU] | ORAL_TABLET | Freq: Every day | ORAL | Status: DC
  Administered 2018-04-15: 1000 [IU] via ORAL
  Filled 2018-04-15: qty 1

## 2018-04-15 MED ORDER — BUPIVACAINE-EPINEPHRINE (PF) 0.5% -1:200000 IJ SOLN
INTRAMUSCULAR | Status: AC
  Filled 2018-04-15: qty 30

## 2018-04-15 MED ORDER — HYDROCHLOROTHIAZIDE 25 MG PO TABS
25.0000 mg | ORAL_TABLET | Freq: Every day | ORAL | Status: DC
  Administered 2018-04-15: 25 mg via ORAL
  Filled 2018-04-15: qty 1

## 2018-04-15 MED ORDER — LACTATED RINGERS IV SOLN
INTRAVENOUS | Status: DC
  Administered 2018-04-15: 09:00:00 via INTRAVENOUS

## 2018-04-15 MED ORDER — HYDROCODONE-ACETAMINOPHEN 7.5-325 MG PO TABS: 1.0000 | ORAL_TABLET | Freq: Once | ORAL | Status: DC | PRN

## 2018-04-15 MED ORDER — MEPERIDINE HCL 50 MG/ML IJ SOLN: 6.2500 mg | INTRAMUSCULAR | Status: DC | PRN

## 2018-04-15 MED ORDER — PROPOFOL 10 MG/ML IV BOLUS
INTRAVENOUS | Status: AC
  Filled 2018-04-15: qty 40

## 2018-04-15 MED ORDER — KETOROLAC TROMETHAMINE 30 MG/ML IJ SOLN: 30.0000 mg | Freq: Four times a day (QID) | INTRAMUSCULAR | Status: DC | PRN

## 2018-04-15 MED ORDER — BUPIVACAINE-EPINEPHRINE (PF) 0.5% -1:200000 IJ SOLN
INTRAMUSCULAR | Status: DC | PRN
  Administered 2018-04-15: 10 mL

## 2018-04-15 MED ORDER — ONDANSETRON HCL 4 MG/2ML IJ SOLN
INTRAMUSCULAR | Status: DC | PRN
  Administered 2018-04-15: 4 mg via INTRAVENOUS

## 2018-04-15 MED ORDER — CALCIUM CARBONATE-VITAMIN D 500-200 MG-UNIT PO TABS
1.0000 | ORAL_TABLET | Freq: Two times a day (BID) | ORAL | Status: DC
  Administered 2018-04-15: 1 via ORAL
  Filled 2018-04-15: qty 1

## 2018-04-15 MED ORDER — LIDOCAINE HCL (PF) 1 % IJ SOLN
INTRAMUSCULAR | Status: AC
  Filled 2018-04-15: qty 5

## 2018-04-15 MED ORDER — PROPOFOL 10 MG/ML IV BOLUS
INTRAVENOUS | Status: DC | PRN
  Administered 2018-04-15: 30 mg via INTRAVENOUS
  Administered 2018-04-15: 150 mg via INTRAVENOUS
  Administered 2018-04-15: 20 mg via INTRAVENOUS
  Administered 2018-04-15: 40 mg via INTRAVENOUS
  Administered 2018-04-15: 50 mg via INTRAVENOUS

## 2018-04-15 MED ORDER — PROMETHAZINE HCL 25 MG/ML IJ SOLN: 6.2500 mg | INTRAMUSCULAR | Status: DC | PRN

## 2018-04-15 MED ORDER — PRENATAL 27-0.8 MG PO TABS
1.0000 | ORAL_TABLET | Freq: Every day | ORAL | Status: DC
  Filled 2018-04-15 (×2): qty 1

## 2018-04-15 MED ORDER — HYDROMORPHONE HCL 1 MG/ML IJ SOLN: 0.2500 mg | INTRAMUSCULAR | Status: DC | PRN

## 2018-04-15 MED ORDER — FLUTICASONE PROPIONATE 50 MCG/ACT NA SUSP: 1.0000 | Freq: Every day | NASAL | Status: DC | PRN

## 2018-04-15 MED ORDER — BENAZEPRIL HCL 10 MG PO TABS: 20.0000 mg | ORAL_TABLET | Freq: Every day | ORAL | Status: DC

## 2018-04-15 MED ORDER — SODIUM CHLORIDE 0.9 % IR SOLN
Status: DC | PRN
  Administered 2018-04-15: 1000 mL

## 2018-04-15 MED ORDER — SUCCINYLCHOLINE CHLORIDE 20 MG/ML IJ SOLN
INTRAMUSCULAR | Status: AC
  Filled 2018-04-15: qty 1

## 2018-04-15 MED ORDER — FENTANYL CITRATE (PF) 100 MCG/2ML IJ SOLN
INTRAMUSCULAR | Status: DC | PRN
  Administered 2018-04-15 (×3): 25 ug via INTRAVENOUS

## 2018-04-15 MED ORDER — OXYCODONE-ACETAMINOPHEN 5-325 MG PO TABS
1.0000 | ORAL_TABLET | Freq: Four times a day (QID) | ORAL | Status: DC | PRN
  Administered 2018-04-15: 1 via ORAL
  Filled 2018-04-15: qty 1

## 2018-04-15 MED ORDER — ALBUTEROL SULFATE (2.5 MG/3ML) 0.083% IN NEBU: 3.0000 mL | INHALATION_SOLUTION | Freq: Four times a day (QID) | RESPIRATORY_TRACT | Status: DC | PRN

## 2018-04-15 MED ORDER — LEVOTHYROXINE SODIUM 75 MCG PO TABS: 75.0000 ug | ORAL_TABLET | Freq: Every day | ORAL | Status: DC

## 2018-04-15 SURGICAL SUPPLY — 27 items
CLOTH BEACON ORANGE TIMEOUT ST (SAFETY) ×3 IMPLANT
COVER LIGHT HANDLE STERIS (MISCELLANEOUS) ×6 IMPLANT
COVER MAYO STAND XLG (DRAPE) ×3 IMPLANT
DECANTER SPIKE VIAL GLASS SM (MISCELLANEOUS) ×3 IMPLANT
ELECT NEEDLE TIP 2.8 STRL (NEEDLE) ×3 IMPLANT
ELECT REM PT RETURN 9FT ADLT (ELECTROSURGICAL) ×3
ELECTRODE REM PT RTRN 9FT ADLT (ELECTROSURGICAL) ×1 IMPLANT
GAUZE SPONGE 4X4 12PLY STRL (GAUZE/BANDAGES/DRESSINGS) ×3 IMPLANT
GLOVE BIOGEL PI IND STRL 7.0 (GLOVE) ×2 IMPLANT
GLOVE BIOGEL PI IND STRL 9 (GLOVE) ×1 IMPLANT
GLOVE BIOGEL PI INDICATOR 7.0 (GLOVE) ×4
GLOVE BIOGEL PI INDICATOR 9 (GLOVE) ×2
GLOVE ECLIPSE 6.5 STRL STRAW (GLOVE) ×3 IMPLANT
GLOVE ECLIPSE 9.0 STRL (GLOVE) ×3 IMPLANT
GOWN SPEC L3 XXLG W/TWL (GOWN DISPOSABLE) ×3 IMPLANT
GOWN STRL REUS W/TWL LRG LVL3 (GOWN DISPOSABLE) ×3 IMPLANT
INST SET HYSTEROSCOPY (KITS) ×3 IMPLANT
IV NS IRRIG 3000ML ARTHROMATIC (IV SOLUTION) ×3 IMPLANT
KIT TURNOVER KIT A (KITS) ×3 IMPLANT
MANIFOLD NEPTUNE II (INSTRUMENTS) ×3 IMPLANT
NS IRRIG 1000ML POUR BTL (IV SOLUTION) ×3 IMPLANT
PACK PERI GYN (CUSTOM PROCEDURE TRAY) ×3 IMPLANT
PAD ARMBOARD 7.5X6 YLW CONV (MISCELLANEOUS) ×3 IMPLANT
PAD TELFA 3X4 1S STER (GAUZE/BANDAGES/DRESSINGS) ×3 IMPLANT
SET BASIN LINEN APH (SET/KITS/TRAYS/PACK) ×3 IMPLANT
SET CYSTO W/LG BORE CLAMP LF (SET/KITS/TRAYS/PACK) ×3 IMPLANT
SYR CONTROL 10ML LL (SYRINGE) ×3 IMPLANT

## 2018-04-15 NOTE — Anesthesia Procedure Notes (Signed)
Procedure Name: LMA Insertion Date/Time: 04/15/2018 11:05 AM Performed by: Vista Deck, CRNA Pre-anesthesia Checklist: Patient identified, Patient being monitored, Emergency Drugs available, Timeout performed and Suction available Patient Re-evaluated:Patient Re-evaluated prior to induction Oxygen Delivery Method: Circle System Utilized Preoxygenation: Pre-oxygenation with 100% oxygen Induction Type: IV induction Ventilation: Mask ventilation without difficulty LMA: LMA inserted LMA Size: 4.0 Number of attempts: 1 Placement Confirmation: positive ETCO2 and breath sounds checked- equal and bilateral Tube secured with: Tape Dental Injury: Teeth and Oropharynx as per pre-operative assessment

## 2018-04-15 NOTE — Discharge Summary (Signed)
Physician Discharge Summary  Patient ID: Christine Foley MRN: [id number] DOB/AGE: 04-01-1961 57 y.o.  Admit date: 04/15/2018 Discharge date: 04/15/2018  Admission Diagnoses: Cardiac sinus pause after hysteroscopy  Discharge Diagnoses:  Active Problems:   Sinus pause   Sleep apnea in adult   Discharged Condition: good  Hospital Course:  History of Present Illness    [name] presented to [facility] ED on 04/15/2018 for planned [facility] with hysteroscopy and endometrial polypectomy due to post-menopausal bleeding. Also underwent excision of multiple skin tags. During the procedure, she was noted to have "30 seconds of extreme bradycardia, with up to 4 seconds between QRS complexes then resumed normal sinus rhythm at approximately 30 seconds from the onset of the abnormality" as outlined in the Op Note by Dr. Glo Herring.   Telemetry strips were personally reviewed and do show episodes of sinus pauses lasting for up to 4 seconds. Her return rhythm was normal sinus rhythm.  In talking with the patient this afternoon, she denies any prior cardiac history of known CAD or cardiac arrhythmias. Reports being in her usual state of health prior to her scheduled surgery and denies any recent episodes of chest pain, dyspnea on exertion, orthopnea, PND, or lower extremity edema. She does have a history of sleep apnea but reports being intolerant to the mask as this would keep her awake for several hours at night.  Reports overall feeling back to her normal state of health at this time and denies any associated symptoms of lightheadedness, dizziness, or presyncope.  She has been monitored on telemetry in the postoperative setting and is maintaining normal sinus rhythm with heart rate in the 70s to 80s with no significant pauses noted.     Consults: cardiology  Significant Diagnostic Studies: telemetry  Treatments: surgery: hysteroscopy, removal skin tags,                      Telemetry x 8  hours.(approx)  Discharge Exam: Blood pressure (!) 100/52, pulse 90, temperature 97.9 F (36.6 C), temperature source Oral, resp. rate 17, height 4\' 11"  (1.499 m), weight 254 lb (115.2 kg), SpO2 97 %. Physical Examination: General appearance - alert, well appearing, and in no distress and overweight Mental status - alert, oriented to person, place, and time, normal mood, behavior, speech, dress, motor activity, and thought processes Abdomen - soft, nontender, nondistended, no masses or organomegaly no rebound tenderness noted Pelvic - normal external genitalia, vulva, vagina, cervix, uterus and adnexa, no bleeding post procedure Extremities - peripheral pulses normal, no pedal edema, no clubbing or cyanosis, Homan's sign negative bilaterally   Disposition:     Follow-up Information    Jonnie Kind, MD Follow up in 2 week(s).   Specialties:  Obstetrics and Gynecology, Radiology Contact information: [place] [address] [phone]           Signed: Jonnie Kind 04/15/2018, 8:20 PM

## 2018-04-15 NOTE — Transfer of Care (Addendum)
Immediate Anesthesia Transfer of Care Note  Patient: Christine Foley  Procedure(s) Performed: DILATATION AND CURETTAGE /HYSTEROSCOPY (N/A ) ENDOMETRIAL POLYPECTOMY (N/A ) EXCISION OF MULTIPLE SKIN TAGS CHEST WALL (8 below right breast, 14 below left breast) AND THIGHS (10 inner thighs) (N/A )  Patient Location: PACU  Anesthesia Type:General  Level of Consciousness: awake, oriented and patient cooperative  Airway & Oxygen Therapy: Patient Spontanous Breathing and non-rebreather face mask  Post-op Assessment: Report given to RN and Post -op Vital signs reviewed and stable  Post vital signs: Reviewed and stable  Last Vitals:  Vitals Value Taken Time  BP 115/82 04/15/2018 12:21 PM  Temp    Pulse 89 04/15/2018 12:22 PM  Resp 16 04/15/2018 12:22 PM  SpO2 97 % 04/15/2018 12:22 PM  Vitals shown include unvalidated device data.  Last Pain:  Vitals:   04/15/18 0832  TempSrc: Oral  PainSc: 0-No pain      Patients Stated Pain Goal: 5 (56/25/63 8937)  Complications: Patient  EKG severe bradycardia when legs lowered from Litho. Position. OR nurse thumped patient chest. Return to SR without medications. LMA removed.  Awake alert and no further EKG changes. See Chart for strips.

## 2018-04-15 NOTE — Addendum Note (Signed)
Addendum  created 04/15/18 1537 by Vista Deck, CRNA   Intraprocedure Event edited, Sign clinical note

## 2018-04-15 NOTE — Progress Notes (Signed)
Patient discharged per MD order. IV removed and discharge papers given to patient. Belongings with patient. Patient left via wheelchair to go home with family.

## 2018-04-15 NOTE — Op Note (Addendum)
Please see the brief op note.

## 2018-04-15 NOTE — Anesthesia Postprocedure Evaluation (Signed)
Anesthesia Post Note  Patient: JONA ERKKILA  Procedure(s) Performed: DILATATION AND CURETTAGE /HYSTEROSCOPY (N/A ) ENDOMETRIAL POLYPECTOMY (N/A ) EXCISION OF MULTIPLE SKIN TAGS CHEST WALL (8 below right breast, 14 below left breast) AND THIGHS (10 inner thighs) (N/A )  Patient location during evaluation: Nursing Unit Anesthesia Type: General Level of consciousness: awake and alert and patient cooperative Pain management: satisfactory to patient Vital Signs Assessment: post-procedure vital signs reviewed and stable Respiratory status: spontaneous breathing Cardiovascular status: stable Postop Assessment: no apparent nausea or vomiting Anesthetic complications: yes Anesthetic complication details: anesthesia complicationsComments: Patient had severe brady cardia  when legs were repositioned to supine position from litho position at end of case. See chart for EKG strips.      Last Vitals:  Vitals:   04/15/18 1430 04/15/18 1445  BP: (!) 143/93 127/85  Pulse: 76 83  Resp: 13 15  Temp:    SpO2: 97% 97%    Last Pain:  Vitals:   04/15/18 1245  TempSrc:   PainSc: 0-No pain                 Jamesa Tedrick

## 2018-04-15 NOTE — Op Note (Addendum)
04/15/2018  12:22 PM  PATIENT:  Christine Foley  57 y.o. female  PRE-OPERATIVE DIAGNOSIS:  post menopausal bleeding, chronic irritation from skin tag locations  POST-OPERATIVE DIAGNOSIS:  post menopausal bleeding, chronic irritation from skin tag locations, chest under arm and thighs Sinus bradycardia in wakeup part of case.  PROCEDURE:  Procedure(s): DILATATION AND CURETTAGE /HYSTEROSCOPY (N/A) ENDOMETRIAL POLYPECTOMY (N/A) EXCISION OF MULTIPLE SKIN TAGS CHEST WALL (8 below right breast, 14 below left breast) AND THIGHS (10 inner thighs) (N/A)  SURGEON:  Surgeon(s) and Role:    Jonnie Kind, MD - Primary  PHYSICIAN ASSISTANT:   ASSISTANTS: none   ANESTHESIA:   general and paracervical block  EBL:  0 mL   BLOOD ADMINISTERED:none  DRAINS: none   LOCAL MEDICATIONS USED:  MARCAINE    and Amount: 10 ml  SPECIMEN:  Source of Specimen:  endometrial polyp and curettings.  DISPOSITION OF SPECIMEN:  PATHOLOGY  COUNTS:  YES  TOURNIQUET:  * No tourniquets in log *  DICTATION: .Dragon Dictation  PLAN OF CARE: Admit for overnight observation to confirm normal cardiac function  PATIENT DISPOSITION:  PACU - hemodynamically stable.   Delay start of Pharmacological VTE agent (>24hrs) due to surgical blood loss or risk of bleeding: not applicable Will observe on telemetry bed overnight.   Complications no surgical complications but during the part of the case with the patient was reawakening, she developed 30 seconds of extreme bradycardia, with up to 4 seconds between QRS complexes.  She never became completely flaccid and maintained some degree of muscle tone but she went as long as 4 seconds without a QRS complex, then resumed normal sinus rhythm at approximately 30 seconds from the onset of the abnormality this is not associated with any medication administration.  During the case she had had Bovie cautery of chest wall and axilla lesions but none of this have been done in  over 20 minutes, she had had a paracervical block in which 10 cc of 0.5% Marcaine with epinephrine was used with no suspicion of intravascular injection and no administration this medicine approximately 15 minutes before she had her bradycardic episode The episode was caught on cardiac monitoring and the tracing is available in the chart Patient is currently without chest pain and without cardiac symptoms  Details of procedure: Patient was taken the operating room prepped and draped for multiple location work, timeout was conducted and procedure confirmed by surgical team.  Plans were to excise multiple skin tags from the left axilla of the left infra mammary chest wall, the right chest wall and then to proceed with the vaginal work including removal of skin tags on the thighs  Timeout was conducted procedure confirmed by surgical team and areas prepped and draped.  No antibiotics were administered.  The patient was a stable condition and then using a needle tip cautery on low settings of 20 W cutting and 20 W coagulation needle tip cautery was used to amputate the skin pedicles beginning in the left inframammary area, then the left axilla than the right inframammary area removing these in easily simple fashion no dysrhythmias during this portion of the case.  The inner thighs were then anchored cleaned of the skin tags and then proceed we proceeded with a hysteroscopy.  Hysteroscopy involve placement of a long single open sided speculum and placing it at a 45 degree angle in such a way that we might have some chance to access the very anteflexed anteverted cervix and uterus  cervix can be grasped with single-tooth tenaculum was sounded to 9 cm in the extremely anteflexed position dilated to 25 [place] which allowed introduction of the 30 degree rigid hysteroscope..  Just before doing the hysteroscopy the paracervical block was performed using 10 cc of Marcaine injected all around the cervix after aspirating  to confirm no intravascular location.  Again the patient remained stable during this portion of the case.  Once hysteroscopy could be initiated, we can see that the endometrial cavity had a large polyp.  This was able to be removed by Randall stone forceps grasping of the polyp and extracting it after efforts at hysteroscopic grasping of it were unsuccessful.  Specimen was passed off and then smooth sharp curettage performed which obtained only a scant amount of tissue.  Hysteroscopy was repeated and showed a essentially normal endometrial cavity.  The small remnant of the hysteroscopic Truman Hayward identified polyp was noted to be allergic originating from the fundus of the cyst in front of the right tubal ostia.  Smooth sharp curettage was again attempted to make sure that we were removing all the tissue from the endometrial cavity.  There was no additional fragments of tissue removed. Patient was then considered a candidate for waking up.  During the work-up process the patient began to breathe on her own and then I was notified by anesthesia that she was developing a extreme bradycardia and CPR code called.  The patient had a 30-second strip where there was bradycardia with normal QRS complex up to 5 seconds apart.  Patient tolerated procedure well and then during awakening, the patient developed an episode of bradycardia as described above, with up to 4 seconds between heartbeat QRS complexes  Patient has spontaneous reversal of the bradycardia woke up without further complications and has been on the telemetry and recovering without further episodes of bradycardia.  There is no chest pain.  We will observe her on telemetry until Dr. Bronson Ing, cardiology can evaluate her and given opinion

## 2018-04-15 NOTE — Consult Note (Addendum)
Cardiology Consult    Patient ID: ARYNN ARMAND; [id number]; 11-Oct-1961   Admit date: 04/15/2018 Date of Consult: 04/15/2018  Primary Care Provider: Abran Richard, MD Primary Cardiologist: New to [facility] - Dr. Bronson Ing  Patient Profile    Christine Foley is a 57 y.o. female with past medical history of HTN, HLD, and OSA (intolerant to CPAP) who is being seen today for the evaluation of bradycardia and sinus pauses in the perioperative setting at the request of Dr. Glo Herring.   History of Present Illness    Christine Foley presented to [facility] ED on 04/15/2018 for planned [facility] with hysteroscopy and endometrial polypectomy due to post-menopausal bleeding. Also underwent excision of multiple skin tags. During the procedure, she was noted to have "30 seconds of extreme bradycardia, with up to 4 seconds between QRS complexes then resumed normal sinus rhythm at approximately 30 seconds from the onset of the abnormality" as outlined in the Op Note by Dr. Glo Herring.   Telemetry strips were personally reviewed and do show episodes of sinus pauses lasting for up to 4 seconds. Her return rhythm was normal sinus rhythm.  In talking with the patient this afternoon, she denies any prior cardiac history of known CAD or cardiac arrhythmias. Reports being in her usual state of health prior to her scheduled surgery and denies any recent episodes of chest pain, dyspnea on exertion, orthopnea, PND, or lower extremity edema. She does have a history of sleep apnea but reports being intolerant to the mask as this would keep her awake for several hours at night.  Reports overall feeling back to her normal state of health at this time and denies any associated symptoms of lightheadedness, dizziness, or presyncope.  She has been monitored on telemetry in the postoperative setting and is maintaining normal sinus rhythm with heart rate in the 70s to 80s with no significant pauses noted.   Past Medical History:    Diagnosis Date  . Anemia   . Arthritis   . Asthma   . GERD (gastroesophageal reflux disease)   . Hyperlipidemia   . Hypertension   . Hypothyroidism   . 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 Sleep apnea    did not tolerate machine  . Thyroid disease     Past Surgical History:  Procedure Laterality Date  . BIOPSY  01/08/2017   Procedure: BIOPSY;  Surgeon: Danie Binder, MD;  Location: [facility];  Service: Endoscopy;;  cecal polyps x2, ascending colon polyp, duodenal bx's , gastric bx's , gastric polyps bx  . carpel tunnel     right  . CESAREAN SECTION     x 2  . COLONOSCOPY WITH ESOPHAGOGASTRODUODENOSCOPY (EGD)  2008   Dr. Oneida Alar: Sessile polypoid appearing lesion in the midesophagus benign biopsy, normal appearing gastric mucosa, biopsy with chronic gastritis with H pylori. Small bowel looked normal, biopsy negative for celiac. Terminal ileum was normal, colon normal. next TCS 2018.   Marland Kitchen COLONOSCOPY WITH PROPOFOL N/A 01/08/2017   Procedure: COLONOSCOPY WITH PROPOFOL;  Surgeon: Danie Binder, MD;  Location: [facility];  Service: Endoscopy;  Laterality: N/A;  8:45AM  . ESOPHAGOGASTRODUODENOSCOPY (EGD) WITH PROPOFOL N/A 01/08/2017   Procedure: ESOPHAGOGASTRODUODENOSCOPY (EGD) WITH PROPOFOL;  Surgeon: Danie Binder, MD;  Location: [facility];  Service: Endoscopy;  Laterality: N/A;  . GIVENS CAPSULE STUDY N/A 01/31/2017   Procedure: GIVENS CAPSULE STUDY;  Surgeon: Danie Binder, MD;  Location: [facility];  Service: Endoscopy;  Laterality: N/A;  7:30am, patient to arrive at 7:00am  . iron infusion      . POLYPECTOMY  01/08/2017   Procedure: POLYPECTOMY;  Surgeon: Danie Binder, MD;  Location: [facility];  Service: Endoscopy;;  distal transverse colon polyp  . TUBAL LIGATION       Home Medications:  Prior to Admission medications   Medication Sig Start Date End Date Taking? Authorizing Provider  acetaminophen (TYLENOL) 500 MG tablet Take 1,000 mg by mouth every 6 (six)  hours as needed (for pain/headache).   Yes [provider]  albuterol (PROVENTIL HFA;VENTOLIN HFA) 108 (90 Base) MCG/ACT inhaler Inhale 2 puffs into the lungs every 6 (six) hours as needed for wheezing or shortness of breath.   Yes [provider]  benazepril (LOTENSIN) 20 MG tablet Take 20 mg by mouth daily. 03/13/15  Yes [provider]  Calcium Carb-Cholecalciferol (CALCIUM/VITAMIN D PO) Take 1 tablet by mouth 2 (two) times daily. 1200-800   Yes [provider]  cetirizine (ZYRTEC) 10 MG tablet Take 10 mg by mouth daily.    Yes [provider]  cholecalciferol (VITAMIN D) 1000 units tablet Take 1,000 Units by mouth daily.   Yes [provider]  fluticasone (FLONASE) 50 MCG/ACT nasal spray Place 1-2 sprays into both nostrils daily as needed (for allergies).  02/04/15  Yes [provider]  hydrochlorothiazide (HYDRODIURIL) 25 MG tablet Take 25 mg by mouth daily. 01/10/15  Yes [provider]  ibuprofen (ADVIL,MOTRIN) 800 MG tablet Take 800 mg by mouth every 8 (eight) hours as needed for mild pain or moderate pain.  03/05/17  Yes [provider]  levothyroxine (SYNTHROID, LEVOTHROID) 75 MCG tablet Take 75 mcg by mouth daily before breakfast.  01/06/15  Yes [provider]  medroxyPROGESTERone (PROVERA) 5 MG tablet TAKE ONE TABLET BY MOUTH DAILY FOR 2 WEEKS. REPEAT EVERY 3 MONTHS AND RECORD MENSTRUAL PERIOD 11/01/17  Yes Jonnie Kind, MD  pravastatin (PRAVACHOL) 20 MG tablet Take 20 mg by mouth at bedtime.  01/06/15  Yes [provider]  ranitidine (ZANTAC) 150 MG capsule Take 150 mg by mouth daily before supper.    Yes [provider]  miconazole (MICATIN) 2 % cream Apply 1 application topically 2 (two) times daily. Patient not taking: Reported on 04/01/2018 03/21/18   Jonnie Kind, MD    Inpatient Medications: Scheduled Meds: . [START ON 04/16/2018] benazepril  20 mg Oral Daily  . calcium-vitamin  D  1 tablet Oral BID WC  . cholecalciferol  1,000 Units Oral Daily  . hydrochlorothiazide  25 mg Oral Daily  . [START ON 04/16/2018] levothyroxine  75 mcg Oral QAC breakfast  . multivitamin-prenatal  1 tablet Oral Q1200   Continuous Infusions:  PRN Meds: albuterol, fluticasone, ketorolac, oxyCODONE-acetaminophen  Allergies:    Allergies  Allergen Reactions  . Penicillins Hives, Shortness Of Breath and Rash    Has patient had a PCN reaction causing immediate rash, facial/tongue/throat swelling, SOB or lightheadedness with hypotension:Yes Has patient had a PCN reaction causing severe rash involving mucus membranes or skin necrosis:unsure Has patient had a PCN reaction that required hospitalization:Treated in ER Has patient had a PCN reaction occurring within the last 10 years:unsure If all of the above answers are "NO", then may proceed with Cephalosporin use.     Social History:   Social History   Socioeconomic History  . Marital status: Married    Spouse name: Not on file  . Number of children: Not on file  .  Years of education: Not on file  . Highest education level: Not on file  Occupational History  . Not on file  Social Needs  . Financial resource strain: Not on file  . Food insecurity:    Worry: Not on file    Inability: Not on file  . Transportation needs:    Medical: Not on file    Non-medical: Not on file  Tobacco Use  . Smoking status: Never Smoker  . Smokeless tobacco: Never Used  Substance and Sexual Activity  . Alcohol use: No  . Drug use: No  . Sexual activity: Not Currently    Birth control/protection: Surgical  Lifestyle  . Physical activity:    Days per week: Not on file    Minutes per session: Not on file  . Stress: Not on file  Relationships  . Social connections:    Talks on phone: Not on file    Gets together: Not on file    Attends religious service: Not on file    Active member of club or organization: Not on file    Attends meetings  of clubs or organizations: Not on file    Relationship status: Not on file  . Intimate partner violence:    Fear of current or ex partner: Not on file    Emotionally abused: Not on file    Physically abused: Not on file    Forced sexual activity: Not on file  Other Topics Concern  . Not on file  Social History Narrative  . Not on file     Family History:    Family History  Problem Relation Age of Onset  . Diabetes Mother   . Hypertension Father   . Breast cancer Maternal Grandmother   . Colon cancer Neg Hx       Review of Systems    General:  No chills, fever, night sweats or weight changes.  Cardiovascular:  No chest pain, dyspnea on exertion, edema, orthopnea, palpitations, paroxysmal nocturnal dyspnea. Dermatological: No rash, lesions/masses Respiratory: No cough, dyspnea Urologic: No hematuria, dysuria Reproductive: Positive for vaginal bleeding.  Abdominal:   No nausea, vomiting, diarrhea, bright red blood per rectum, melena, or hematemesis Neurologic:  No visual changes, wkns, changes in mental status.  All other systems reviewed and are otherwise negative except as noted above.  Physical Exam/Data    Vitals:   04/15/18 1430 04/15/18 1445 04/15/18 1546 04/15/18 1607  BP: (!) 143/93 127/85    Pulse: 76 83    Resp: 13 15    Temp:    97.9 F (36.6 C)  TempSrc:    Oral  SpO2: 97% 97%    Weight:   254 lb (115.2 kg)   Height:   4\' 11"  (1.499 m)     Intake/Output Summary (Last 24 hours) at 04/15/2018 1659 Last data filed at 04/15/2018 1208 Gross per 24 hour  Intake 900 ml  Output 200 ml  Net 700 ml   Filed Weights   04/15/18 1546  Weight: 254 lb (115.2 kg)   Body mass index is 51.3 kg/m.   General: Pleasant, Caucasian female appearing in NAD Psych: Normal affect. Neuro: Alert and oriented X 3. Moves all extremities spontaneously. HEENT: Normal  Neck: Supple without bruits or JVD. Lungs:  Resp regular and unlabored, CTA without wheezing or  rales. Heart: RRR no s3, s4, or murmurs. Abdomen: Soft, non-tender, non-distended, BS + x 4.  Extremities: No clubbing, cyanosis or edema. DP/PT/Radials 2+ and equal bilaterally.  EKG:  The EKG was personally reviewed and demonstrates: NSR, HR 71, with no diagnostic ST changes when compared to prior tracings.   Telemetry:  Telemetry was personally reviewed and demonstrates: NSR, HR in 70's to 90's.    Labs/Studies     Relevant CV Studies:  None on File.   Laboratory Data:  Chemistry Recent Labs  Lab 04/09/18 0907  NA 139  K 3.2*  CL 100*  CO2 31  GLUCOSE 100*  BUN 14  CREATININE 0.67  CALCIUM 9.4  GFRNONAA >60  GFRAA >60  ANIONGAP 8    Recent Labs  Lab 04/09/18 0907  PROT 7.2  ALBUMIN 4.1  AST 18  ALT 21  ALKPHOS 58  BILITOT 0.9   Hematology Recent Labs  Lab 04/09/18 0907  WBC 6.4  RBC 5.54*  HGB 15.6*  HCT 47.7*  MCV 86.1  MCH 28.2  MCHC 32.7  RDW 13.2  PLT 299   Cardiac EnzymesNo results for input(s): TROPONINI in the last 168 hours. No results for input(s): TROPIPOC in the last 168 hours.  BNPNo results for input(s): BNP, PROBNP in the last 168 hours.  DDimer No results for input(s): DDIMER in the last 168 hours.  Radiology/Studies:  No results found.   Assessment & Plan    1. Sinus Pauses - Noted to have occurred during her surgery earlier today. By review of strips she was having episodes of sinus pauses lasting for up to 4 seconds and then returned to normal sinus rhythm. She has been followed on telemetry since with no recurrent bradycardia. - In talking with the patient, she denies any history of cardiac arrhythmias but does have a history of untreated sleep apnea due to being intolerant to CPAP. - Would recommend continuing to follow on telemetry overnight and if no significant arrhythmias, likely discharge tomorrow morning from a cardiac perspective. Continue to avoid AV nodal blocking agents.  2. HTN - continue PTA Benazepril  20mg  daily.   3. Hypothyroidism - will recheck TSH given her sinus pauses as noted above.    For questions or updates, please contact [place] Please consult [email or web address] for contact info under Cardiology/STEMI.  Signed, Erma Heritage, PA-C 04/15/2018, 4:59 PM Pager: [phone]  The patient was seen and examined, and I agree with the history, physical exam, assessment and plan as documented above, with modifications as noted below. I have also personally reviewed all relevant documentation, old records, labs, and both radiographic and cardiovascular studies. I have also independently interpreted old and new ECG's.  Briefly, this is a 57 yr old woman with a past medical history significant for hypertension, hyperlipidemia, and obstructive sleep apnea (intolerant to CPAP) whom I was asked to evaluate by Dr. Glo Herring for "bradycardia and asystole".  She underwent scheduled dilatation and curettage with hysteroscopy and endometrial polypectomy and removal of multiple skin tags. She was given a low dose of bupivacaine. Shortly thereafter, she was noted to have multiple sinus pauses lasting up to 4 seconds (I personally reviewed the telemetry strips and ECG). She has no known coronary artery disease nor history of arrhythmias.  Upon speaking with her further, she has been intolerant of CPAP and last attempted it about 2 years ago. She told me it kept her up all night but as soon as she removed it, she slept soundly. She is obese.  The patient denies any symptoms of chest pain, palpitations, shortness of breath, lightheadedness, dizziness, leg swelling, orthopnea, PND, and syncope.  She was transferred from  the post-op recovery unit to the ICU where she was monitored on telemetry which demonstrated sinus rhythm in the 70-80 bpm range. There were no further pauses observed.  Recommendations: As she has remained hemodynamically stable, she can likely be observed and then discharged. I  would recommend checking a TSH and avoidance of AV nodal blocking agents.  The likely etiology for her arrhythmia is obesity and consequent untreated obstructive sleep apnea. I informed her of the deleterious potential consequences of this, such as increased risk for MI and CVA as well as additional arrhythmias such as atrial fibrillation. I recommended she follow up with her PCP and see a sleep specialist to see if there are other better-fitting masks she could try. No additional recommendations. Discussed with Dr. Glo Herring, patient, and her family.     Kate Sable, MD, [facility]  04/15/2018 8:59 PM

## 2018-04-15 NOTE — Discharge Instructions (Signed)
See notes earlier in discharge info

## 2018-04-15 NOTE — Interval H&P Note (Signed)
History and Physical Interval Note:  04/15/2018 10:31 AM  Christine Foley  has presented today for surgery, with the diagnosis of post menopausal bleeding, chronic irritation from skin tag locations  The various methods of treatment have been discussed with the patient and family. After consideration of risks, benefits and other options for treatment, the patient has consented to  Procedure(s): DILATATION AND CURETTAGE /HYSTEROSCOPY (N/A) POLYPECTOMY (N/A) EXCISION OF MULTIPLE SKIN TAGS CHEST WALL AND THIGHS (N/A) as a surgical intervention .  The patient's history has been reviewed, patient examined, no change in status, stable for surgery.  I have reviewed the patient's chart and labs.  Questions were answered to the patient's satisfaction.     Jonnie Kind

## 2018-04-15 NOTE — Anesthesia Preprocedure Evaluation (Signed)
Anesthesia Evaluation  Patient identified by MRN, date of birth, ID band Patient awake    Reviewed: Allergy & Precautions, H&P , NPO status , Patient's Chart, lab work & pertinent test results, reviewed documented beta blocker date and time   Airway Mallampati: II  TM Distance: >3 FB Neck ROM: full    Dental no notable dental hx. (+) Teeth Intact, Dental Advidsory Given   Pulmonary neg pulmonary ROS, asthma , sleep apnea ,    Pulmonary exam normal breath sounds clear to auscultation       Cardiovascular Exercise Tolerance: Good hypertension, On Medications negative cardio ROS   Rhythm:regular Rate:Normal     Neuro/Psych negative neurological ROS  negative psych ROS   GI/Hepatic negative GI ROS, Neg liver ROS, GERD  ,  Endo/Other  negative endocrine ROSHypothyroidism Morbid obesity  Renal/GU negative Renal ROS  negative genitourinary   Musculoskeletal   Abdominal   Peds  Hematology negative hematology ROS (+) anemia ,   Anesthesia Other Findings EKG at 71 NSR,  OSA without CPAP/BiPAP at home.  No O2 Dyspnea with exertion Morbid obesity   Reproductive/Obstetrics negative OB ROS                             Anesthesia Physical Anesthesia Plan  ASA: III  Anesthesia Plan: General LMA   Post-op Pain Management:    Induction:   PONV Risk Score and Plan:   Airway Management Planned:   Additional Equipment:   Intra-op Plan:   Post-operative Plan:   Informed Consent: I have reviewed the patients History and Physical, chart, labs and discussed the procedure including the risks, benefits and alternatives for the proposed anesthesia with the patient or authorized representative who has indicated his/her understanding and acceptance.   Dental Advisory Given  Plan Discussed with: CRNA and Anesthesiologist  Anesthesia Plan Comments:         Anesthesia Quick Evaluation

## 2018-04-15 NOTE — Anesthesia Postprocedure Evaluation (Signed)
Anesthesia Post Note  Patient: Christine Foley  Procedure(s) Performed: DILATATION AND CURETTAGE /HYSTEROSCOPY (N/A ) ENDOMETRIAL POLYPECTOMY (N/A ) EXCISION OF MULTIPLE SKIN TAGS CHEST WALL (8 below right breast, 14 below left breast) AND THIGHS (10 inner thighs) (N/A )  Patient location during evaluation: PACU Anesthesia Type: General Level of consciousness: awake and alert Pain management: pain level controlled Vital Signs Assessment: post-procedure vital signs reviewed and stable Respiratory status: spontaneous breathing Cardiovascular status: stable Postop Assessment: no apparent nausea or vomiting Anesthetic complications: no     Last Vitals:  Vitals:   04/15/18 1315 04/15/18 1330  BP: 110/76 130/88  Pulse: 74 79  Resp: 10 12  Temp:    SpO2: 98% 96%    Last Pain:  Vitals:   04/15/18 1245  TempSrc:   PainSc: 0-No pain                 ADAMS, AMY A

## 2018-04-15 NOTE — Progress Notes (Signed)
Day of Surgery Procedure(s) (LRB): DILATATION AND CURETTAGE /HYSTEROSCOPY (N/A) ENDOMETRIAL POLYPECTOMY (N/A) EXCISION OF MULTIPLE SKIN TAGS CHEST WALL (8 below right breast, 14 below left breast) AND THIGHS (10 inner thighs) (N/A)  Subjective: Patient reports tolerating PO and no problems voiding.   Telemetry has been normal now for 6 hours since procedure.  Objective: I have reviewed patient's vital signs, medications and provider notes from cardiology appreciated..  General: alert, cooperative and no distress Resp: unlabored breathing. GI: normal findings: soft, non-tender Vaginal Bleeding: minimal  Assessment: s/p Procedure(s): DILATATION AND CURETTAGE /HYSTEROSCOPY (N/A) ENDOMETRIAL POLYPECTOMY (N/A) EXCISION OF MULTIPLE SKIN TAGS CHEST WALL (8 below right breast, 14 below left breast) AND THIGHS (10 inner thighs) (N/A): stable Sinus pauses  Plan: Discharge home this pm.  LOS: 0 days    Jonnie Kind 04/15/2018, 8:13 PM

## 2018-04-16 ENCOUNTER — Encounter (HOSPITAL_COMMUNITY): Payer: Self-pay | Admitting: Obstetrics and Gynecology

## 2018-04-16 MED ORDER — PHENYLEPHRINE 40 MCG/ML (10ML) SYRINGE FOR IV PUSH (FOR BLOOD PRESSURE SUPPORT)
PREFILLED_SYRINGE | INTRAVENOUS | Status: AC
  Filled 2018-04-16: qty 10

## 2018-04-16 MED ORDER — PROPOFOL 10 MG/ML IV BOLUS
INTRAVENOUS | Status: AC
  Filled 2018-04-16: qty 20

## 2018-04-23 ENCOUNTER — Encounter: Payer: BLUE CROSS/BLUE SHIELD | Admitting: Obstetrics and Gynecology

## 2018-04-24 ENCOUNTER — Encounter: Payer: Self-pay | Admitting: Obstetrics and Gynecology

## 2018-04-24 ENCOUNTER — Ambulatory Visit: Payer: BLUE CROSS/BLUE SHIELD | Admitting: Obstetrics and Gynecology

## 2018-04-24 VITALS — BP 112/73 | HR 81 | Ht 59.0 in | Wt 253.4 lb

## 2018-04-24 DIAGNOSIS — Z9889 Other specified postprocedural states: Secondary | ICD-10-CM

## 2018-04-24 DIAGNOSIS — Z09 Encounter for follow-up examination after completed treatment for conditions other than malignant neoplasm: Secondary | ICD-10-CM

## 2018-04-24 NOTE — Progress Notes (Addendum)
`  Patient ID: Christine Foley, female   DOB: 13-Aug-1961, 57 y.o.   MRN: [id number]    Subjective:  Christine Foley is a 57 y.o. female now 9 days status post DILATATION AND CURETTAGE /HYSTEROSCOPY, ENDOMETRIAL POLYPECTOMY and EXCISION OF MULTIPLE SKIN TAGS CHEST WALL (8 below right breast, 14 below left breast) AND THIGHS (10 inner thighs).   Pt is doing well. She reports no bleeding or discharge. She notes having hot flashes at night which are manageable.  Review of Systems Negative except for hot flashes.   Diet: normal    Bowel movements : normal.  The patient is not having any pain.   Objective:  BP 112/73 (BP Location: Right Arm, Patient Position: Sitting, Cuff Size: Large)   Pulse 81   Ht 4\' 11"  (1.499 m)   Wt 253 lb 6.4 oz (114.9 kg)   BMI 51.18 kg/m   Wt Readings from Last 3 Encounters:  04/24/18 253 lb 6.4 oz (114.9 kg)  04/15/18 254 lb (115.2 kg)  04/09/18 257 lb (116.6 kg)   General:Well developed, well nourished.  No acute distress. Abdomen: Bowel sounds normal, soft, non-tender. Pelvic Exam: DEFERRED  Incision(s):   Healing well, no drainage, no erythema, no hernia, no swelling, no dehiscence,  Assessment:  Post-Op 9 days s/p DILATATION AND CURETTAGE /HYSTEROSCOPY, ENDOMETRIAL POLYPECTOMY and EXCISION OF MULTIPLE SKIN TAGS CHEST WALL (8 below right breast, 14 below left breast) AND THIGHS (10 inner thighs)   Doing well postoperatively.   Plan:  1.Wound care discussed topical Neosporin to each skin tag excision site 2. Current medications 3. Activity restrictions: none 4. return to work: now. 5. Follow up in 3 years.  By signing my name below, I, De Burrs, attest that this documentation has been prepared under the direction and in the presence of Jonnie Kind, MD. Electronically Signed: De Burrs, Medical Scribe. 04/24/18. 1:36 PM.  I personally performed the services described in this documentation, which was SCRIBED in my presence. The  recorded information has been reviewed and considered accurate. It has been edited as necessary during review. Jonnie Kind, MD

## 2018-07-21 DIAGNOSIS — Z029 Encounter for administrative examinations, unspecified: Secondary | ICD-10-CM

## 2018-10-07 ENCOUNTER — Inpatient Hospital Stay (HOSPITAL_COMMUNITY): Payer: Medicaid Other | Attending: Hematology

## 2018-10-07 DIAGNOSIS — N924 Excessive bleeding in the premenopausal period: Secondary | ICD-10-CM | POA: Diagnosis not present

## 2018-10-07 DIAGNOSIS — Z79899 Other long term (current) drug therapy: Secondary | ICD-10-CM | POA: Insufficient documentation

## 2018-10-07 DIAGNOSIS — E611 Iron deficiency: Secondary | ICD-10-CM

## 2018-10-07 DIAGNOSIS — E039 Hypothyroidism, unspecified: Secondary | ICD-10-CM | POA: Insufficient documentation

## 2018-10-07 DIAGNOSIS — I10 Essential (primary) hypertension: Secondary | ICD-10-CM | POA: Diagnosis not present

## 2018-10-07 DIAGNOSIS — D500 Iron deficiency anemia secondary to blood loss (chronic): Secondary | ICD-10-CM | POA: Insufficient documentation

## 2018-10-07 DIAGNOSIS — E876 Hypokalemia: Secondary | ICD-10-CM | POA: Insufficient documentation

## 2018-10-07 LAB — COMPREHENSIVE METABOLIC PANEL
ALBUMIN: 4 g/dL (ref 3.5–5.0)
ALK PHOS: 63 U/L (ref 38–126)
ALT: 23 U/L (ref 0–44)
AST: 21 U/L (ref 15–41)
Anion gap: 10 (ref 5–15)
BUN: 15 mg/dL (ref 6–20)
CALCIUM: 8.8 mg/dL — AB (ref 8.9–10.3)
CO2: 26 mmol/L (ref 22–32)
CREATININE: 0.66 mg/dL (ref 0.44–1.00)
Chloride: 100 mmol/L (ref 98–111)
GFR calc Af Amer: >60 mL/min (ref >60)
GFR calc non Af Amer: >60 mL/min (ref >60)
GLUCOSE: 100 mg/dL — AB (ref 70–99)
Potassium: 3.2 mmol/L — ABNORMAL LOW (ref 3.5–5.1)
Sodium: 136 mmol/L (ref 135–145)
Total Bilirubin: 1.3 mg/dL — ABNORMAL HIGH (ref 0.3–1.2)
Total Protein: 6.9 g/dL (ref 6.5–8.1)

## 2018-10-07 LAB — CBC WITH DIFFERENTIAL/PLATELET
ABS IMMATURE GRANULOCYTES: 0.01 10*3/uL (ref 0.00–0.07)
BASOS ABS: 0.1 10*3/uL (ref 0.0–0.1)
Basophils Relative: 1 %
Eosinophils Absolute: 0.3 10*3/uL (ref 0.0–0.5)
Eosinophils Relative: 4 %
HCT: 47.1 % — ABNORMAL HIGH (ref 36.0–46.0)
HEMOGLOBIN: 15.2 g/dL — AB (ref 12.0–15.0)
IMMATURE GRANULOCYTES: 0 %
LYMPHS PCT: 36 %
Lymphs Abs: 2.7 10*3/uL (ref 0.7–4.0)
MCH: 28.1 pg (ref 26.0–34.0)
MCHC: 32.3 g/dL (ref 30.0–36.0)
MCV: 87.1 fL (ref 80.0–100.0)
MONO ABS: 0.3 10*3/uL (ref 0.1–1.0)
Monocytes Relative: 4 %
NEUTROS ABS: 4 10*3/uL (ref 1.7–7.7)
Neutrophils Relative %: 55 %
Platelets: 332 10*3/uL (ref 150–400)
RBC: 5.41 MIL/uL — AB (ref 3.87–5.11)
RDW: 13.3 % (ref 11.5–15.5)
WBC: 7.3 10*3/uL (ref 4.0–10.5)
nRBC: 0 % (ref 0.0–0.2)

## 2018-10-07 LAB — LACTATE DEHYDROGENASE: LDH: 196 U/L — ABNORMAL HIGH (ref 98–192)

## 2018-10-07 LAB — FERRITIN: Ferritin: 32 ng/mL (ref 11–307)

## 2018-10-14 ENCOUNTER — Inpatient Hospital Stay (HOSPITAL_BASED_OUTPATIENT_CLINIC_OR_DEPARTMENT_OTHER): Payer: Medicaid Other | Admitting: Internal Medicine

## 2018-10-14 ENCOUNTER — Encounter (HOSPITAL_COMMUNITY): Payer: Self-pay | Admitting: Internal Medicine

## 2018-10-14 ENCOUNTER — Other Ambulatory Visit: Payer: Self-pay

## 2018-10-14 VITALS — BP 116/69 | HR 90 | Temp 98.1°F | Resp 16 | Wt 255.9 lb

## 2018-10-14 DIAGNOSIS — E039 Hypothyroidism, unspecified: Secondary | ICD-10-CM

## 2018-10-14 DIAGNOSIS — E876 Hypokalemia: Secondary | ICD-10-CM | POA: Diagnosis not present

## 2018-10-14 DIAGNOSIS — D500 Iron deficiency anemia secondary to blood loss (chronic): Secondary | ICD-10-CM | POA: Diagnosis not present

## 2018-10-14 DIAGNOSIS — Z79899 Other long term (current) drug therapy: Secondary | ICD-10-CM

## 2018-10-14 DIAGNOSIS — E611 Iron deficiency: Secondary | ICD-10-CM

## 2018-10-14 DIAGNOSIS — N924 Excessive bleeding in the premenopausal period: Secondary | ICD-10-CM | POA: Diagnosis not present

## 2018-10-14 DIAGNOSIS — I10 Essential (primary) hypertension: Secondary | ICD-10-CM | POA: Diagnosis not present

## 2018-10-14 NOTE — Progress Notes (Signed)
Diagnosis Iron deficiency - Plan: CBC with Differential/Platelet, Comprehensive metabolic panel, Lactate dehydrogenase, Ferritin  Staging Cancer Staging No matching staging information was found for the patient.  Assessment and Plan:  1.  Iron deficiency anemia. Felt secondary to chronic blood loss from menorrhagia. Underwent complete GI evaluation with colonoscopy, EGD, and video capsule study which were all negative for bleeding/etiology of iron deficiency anemia. Pt was last treated with IV iron in 10/2017.    Labs done 10/07/2018 reviewed and showed WBC 7.3 HB 15.2 plts 332,000.  Chemistries WNL with K+ 3.2 Cr 0.66 and normal LFTs.  Ferritin 32.  Pt has not required IV iron since 10/2017.  She will be seen in follow-up in 10/2019.  She should notify the office if she has any problems prior to that time.    2  Hypokalemia.  K+ decreased at 3.2.  Pt reports intolerance to oral potassium.  Pt will add potassium rich foods and follow-up with PCP for monitoring due to HCTZ therapy.    3.  HTN.  BP is 116/69.  Follow-up with PCP for monitoring.   4.  Hypothyroidism.  Pt on Synthroid.  Follow-up with PCP for monitoring.    5.  Health maintenance.  Continue GI and mammogram evaluations as recommended.     Current Status:  Pt is seen today for follow-up.  She is here to go over labs.    Problem List Patient Active Problem List   Diagnosis Date Noted  . Sinus pause [I45.5] 04/15/2018  . Sleep apnea in adult [G47.30] 04/15/2018  . PMB (postmenopausal bleeding) [N95.0] 03/21/2018  . Obesity, morbid, BMI 50 or higher ([facility] Colon) [E66.01] 03/21/2018  . Iron deficiency anemia [D50.9]   . GERD (gastroesophageal reflux disease) [K21.9] 12/24/2016  . 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 Iron deficiency [E61.1] 10/09/2016  . Abnormal perimenopausal bleeding [N92.4] 04/12/2015    Past Medical History Past Medical History:  Diagnosis Date  . Anemia   . Arthritis   . Asthma   . GERD  (gastroesophageal reflux disease)   . Hyperlipidemia   . Hypertension   . Hypothyroidism   . 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 Sleep apnea    did not tolerate machine  . Thyroid disease     Past Surgical History Past Surgical History:  Procedure Laterality Date  . BIOPSY  01/08/2017   Procedure: BIOPSY;  Surgeon: Danie Binder, MD;  [facility];  Service: Endoscopy;;  cecal polyps x2, ascending colon polyp, duodenal bx's , gastric bx's , gastric polyps bx  . carpel tunnel     right  . CESAREAN SECTION     x 2  . COLONOSCOPY WITH ESOPHAGOGASTRODUODENOSCOPY (EGD)  2008   Dr. Oneida Alar: Sessile polypoid appearing lesion in the midesophagus benign biopsy, normal appearing gastric mucosa, biopsy with chronic gastritis with H pylori. Small bowel looked normal, biopsy negative for celiac. Terminal ileum was normal, colon normal. next TCS 2018.   Marland Kitchen COLONOSCOPY WITH PROPOFOL N/A 01/08/2017   Procedure: COLONOSCOPY WITH PROPOFOL;  Surgeon: Danie Binder, MD;  Location: [facility];  Service: Endoscopy;  Laterality: N/A;  8:45AM  . ESOPHAGOGASTRODUODENOSCOPY (EGD) WITH PROPOFOL N/A 01/08/2017   Procedure: ESOPHAGOGASTRODUODENOSCOPY (EGD) WITH PROPOFOL;  Surgeon: Danie Binder, MD;  [facility]: [facility];  Service: Endoscopy;  Laterality: N/A;  . EXCISION OF SKIN TAG N/A 04/15/2018   Procedure: EXCISION OF MULTIPLE SKIN TAGS CHEST WALL (8 below right breast, 14 below left breast) AND THIGHS (10 inner thighs);  Surgeon: Jonnie Kind, MD;  Location: [facility];  Service: Gynecology;  Laterality: N/A;  . GIVENS CAPSULE STUDY N/A 01/31/2017   Procedure: GIVENS CAPSULE STUDY;  Surgeon: Danie Binder, MD;  [facility]: [facility];  Service: Endoscopy;  Laterality: N/A;  7:30am, patient to arrive at 7:00am  . HYSTEROSCOPY W/D&C N/A 04/15/2018   Procedure: DILATATION AND CURETTAGE /HYSTEROSCOPY;  Surgeon: Jonnie Kind, MD;  Location: [facility];  Service: Gynecology;  Laterality: N/A;  . iron  infusion      . POLYPECTOMY  01/08/2017   Procedure: POLYPECTOMY;  Surgeon: Danie Binder, MD;  [facility];  Service: Endoscopy;;  distal transverse colon polyp  . POLYPECTOMY N/A 04/15/2018   Procedure: ENDOMETRIAL POLYPECTOMY;  Surgeon: Jonnie Kind, MD;  Location: [facility];  Service: Gynecology;  Laterality: N/A;  . TUBAL LIGATION      Family History Family History  Problem Relation Age of Onset  . Diabetes Mother   . Hypertension Father   . Breast cancer Maternal Grandmother   . Colon cancer Neg Hx      Social History  reports that she has never smoked. She has never used smokeless tobacco. She reports that she does not drink alcohol or use drugs.  Medications  Current Outpatient Medications:  .  acetaminophen (TYLENOL) 500 MG tablet, Take 1,000 mg by mouth every 6 (six) hours as needed (for pain/headache)., Disp: , Rfl:  .  albuterol (PROVENTIL HFA;VENTOLIN HFA) 108 (90 Base) MCG/ACT inhaler, Inhale 2 puffs into the lungs every 6 (six) hours as needed for wheezing or shortness of breath., Disp: , Rfl:  .  benazepril (LOTENSIN) 20 MG tablet, Take 20 mg by mouth daily., Disp: , Rfl: 1 .  Calcium Carb-Cholecalciferol (CALCIUM/VITAMIN D PO), Take 1 tablet by mouth 2 (two) times daily. 1200-800, Disp: , Rfl:  .  cetirizine (ZYRTEC) 10 MG tablet, Take 10 mg by mouth daily. , Disp: , Rfl:  .  cholecalciferol (VITAMIN D) 1000 units tablet, Take 1,000 Units by mouth daily., Disp: , Rfl:  .  fluticasone (FLONASE) 50 MCG/ACT nasal spray, Place 1-2 sprays into both nostrils daily as needed (for allergies). , Disp: , Rfl: 0 .  hydrochlorothiazide (HYDRODIURIL) 25 MG tablet, Take 25 mg by mouth daily., Disp: , Rfl: 0 .  ibuprofen (ADVIL,MOTRIN) 800 MG tablet, Take 800 mg by mouth every 8 (eight) hours as needed for mild pain or moderate pain. , Disp: , Rfl: 0 .  levothyroxine (SYNTHROID, LEVOTHROID) 75 MCG tablet, Take 75 mcg by mouth daily before breakfast. , Disp: , Rfl: 0 .   miconazole (MICATIN) 2 % cream, Apply 1 application topically 2 (two) times daily., Disp: 28.35 g, Rfl: prn .  pravastatin (PRAVACHOL) 20 MG tablet, Take 20 mg by mouth at bedtime. , Disp: , Rfl: 0  Allergies Penicillins  Review of Systems Review of Systems - Oncology ROS negative   Physical Exam  Vitals Wt Readings from Last 3 Encounters:  10/14/18 255 lb 14.4 oz (116.1 kg)  04/24/18 253 lb 6.4 oz (114.9 kg)  04/15/18 254 lb (115.2 kg)   Temp Readings from Last 3 Encounters:  10/14/18 98.1 F (36.7 C) (Oral)  04/15/18 97.9 F (36.6 C) (Oral)  04/09/18 98.3 F (36.8 C) (Oral)   BP Readings from Last 3 Encounters:  10/14/18 116/69  04/24/18 112/73  04/15/18 (!) 100/52   Pulse Readings from Last 3 Encounters:  10/14/18 90  04/24/18 81  04/15/18 90  Constitutional: Well-developed, well-nourished, and in no distress.   HENT: Head: Normocephalic and atraumatic.  Mouth/Throat: No oropharyngeal exudate. Mucosa moist. Eyes: Pupils are equal, round, and reactive to light. Conjunctivae are normal. No scleral icterus.  Neck: Normal range of motion. Neck supple. No JVD present.  Cardiovascular: Normal rate, regular rhythm and normal heart sounds.  Exam reveals no gallop and no friction rub.   No murmur heard. Pulmonary/Chest: Effort normal and breath sounds normal. No respiratory distress. No wheezes.No rales.  Abdominal: Soft. Bowel sounds are normal. No distension. There is no tenderness. There is no guarding.  Musculoskeletal: No edema or tenderness.  Lymphadenopathy: No cervical, axillary or supraclavicular adenopathy.  Neurological: Alert and oriented to person, place, and time. No cranial nerve deficit.  Skin: Skin is warm and dry. No rash noted. No erythema. No pallor.  Psychiatric: Affect and judgment normal.   Labs No visits with results within 3 Day(s) from this visit.  Latest known visit with results is:  Appointment on 10/07/2018  Component Date Value Ref  Range Status  . WBC 10/07/2018 7.3  4.0 - 10.5 K/uL Final  . RBC 10/07/2018 5.41* 3.87 - 5.11 MIL/uL Final  . Hemoglobin 10/07/2018 15.2* 12.0 - 15.0 g/dL Final  . HCT 10/07/2018 47.1* 36.0 - 46.0 % Final  . MCV 10/07/2018 87.1  80.0 - 100.0 fL Final  . MCH 10/07/2018 28.1  26.0 - 34.0 pg Final  . MCHC 10/07/2018 32.3  30.0 - 36.0 g/dL Final  . RDW 10/07/2018 13.3  11.5 - 15.5 % Final  . Platelets 10/07/2018 332  150 - 400 K/uL Final  . nRBC 10/07/2018 0.0  0.0 - 0.2 % Final  . Neutrophils Relative % 10/07/2018 55  % Final  . Neutro Abs 10/07/2018 4.0  1.7 - 7.7 K/uL Final  . Lymphocytes Relative 10/07/2018 36  % Final  . Lymphs Abs 10/07/2018 2.7  0.7 - 4.0 K/uL Final  . Monocytes Relative 10/07/2018 4  % Final  . Monocytes Absolute 10/07/2018 0.3  0.1 - 1.0 K/uL Final  . Eosinophils Relative 10/07/2018 4  % Final  . Eosinophils Absolute 10/07/2018 0.3  0.0 - 0.5 K/uL Final  . Basophils Relative 10/07/2018 1  % Final  . Basophils Absolute 10/07/2018 0.1  0.0 - 0.1 K/uL Final  . Immature Granulocytes 10/07/2018 0  % Final  . Abs Immature Granulocytes 10/07/2018 0.01  0.00 - 0.07 K/uL Final   Performed at [facility], [address] [place], [place] [address]  . Sodium 10/07/2018 136  135 - 145 mmol/L Final  . Potassium 10/07/2018 3.2* 3.5 - 5.1 mmol/L Final  . Chloride 10/07/2018 100  98 - 111 mmol/L Final  . CO2 10/07/2018 26  22 - 32 mmol/L Final  . Glucose, Bld 10/07/2018 100* 70 - 99 mg/dL Final  . BUN 10/07/2018 15  6 - 20 mg/dL Final  . Creatinine, Ser 10/07/2018 0.66  0.44 - 1.00 mg/dL Final  . Calcium 10/07/2018 8.8* 8.9 - 10.3 mg/dL Final  . Total Protein 10/07/2018 6.9  6.5 - 8.1 g/dL Final  . Albumin 10/07/2018 4.0  3.5 - 5.0 g/dL Final  . AST 10/07/2018 21  15 - 41 U/L Final  . ALT 10/07/2018 23  0 - 44 U/L Final  . Alkaline Phosphatase 10/07/2018 63  38 - 126 U/L Final  . Total Bilirubin 10/07/2018 1.3* 0.3 - 1.2 mg/dL Final  . GFR calc non Af Amer 10/07/2018 >60   >60 mL/min Final  .  GFR calc Af Amer 10/07/2018 >60  >60 mL/min Final  . Anion gap 10/07/2018 10  5 - 15 Final   Performed at [facility], [address] [place], [place] [address]  . LDH 10/07/2018 196* 98 - 192 U/L Final   Performed at [facility], [address] [place], [place] [address]  . Ferritin 10/07/2018 32  11 - 307 ng/mL Final   Performed at [facility], [address], [place],  [address]     Pathology Orders Placed This Encounter  Procedures  . CBC with Differential/Platelet    Standing Status:   Future    Standing Expiration Date:   10/14/2020  . Comprehensive metabolic panel    Standing Status:   Future    Standing Expiration Date:   10/14/2020  . Lactate dehydrogenase    Standing Status:   Future    Standing Expiration Date:   10/14/2020  . Ferritin    Standing Status:   Future    Standing Expiration Date:   10/14/2020       Zoila Shutter MD

## 2018-10-14 NOTE — Patient Instructions (Signed)
[facility]  Discharge Instructions: You saw Dr. Higgs today                               _______________________________________________________________  Thank you for choosing [facility] at [facility] to provide your oncology and hematology care.  To afford each patient quality time with our providers, please arrive at least 15 minutes before your scheduled appointment.  You need to re-schedule your appointment if you arrive 10 or more minutes late.  We strive to give you quality time with our providers, and arriving late affects you and other patients whose appointments are after yours.  Also, if you no show three or more times for appointments you may be dismissed from the clinic.  Again, thank you for choosing  [facility] at [facility]. Our hope is that these requests will allow you access to exceptional care and in a timely manner. _______________________________________________________________  If you have questions after your visit, please contact our office at [phone] between the hours of 8:30 a.m. and 5:00 p.m. Voicemails left after 4:30 p.m. will not be returned until the following business day. _______________________________________________________________  For prescription refill requests, have your pharmacy contact our office. _______________________________________________________________  Recommendations made by the consultant and any test results will be sent to your referring physician. _______________________________________________________________ 

## 2019-01-14 DIAGNOSIS — Z029 Encounter for administrative examinations, unspecified: Secondary | ICD-10-CM

## 2019-10-08 ENCOUNTER — Other Ambulatory Visit (HOSPITAL_COMMUNITY): Payer: Medicaid Other

## 2019-10-15 ENCOUNTER — Ambulatory Visit (HOSPITAL_COMMUNITY): Payer: Medicaid Other | Admitting: Nurse Practitioner

## 2019-12-14 ENCOUNTER — Encounter: Payer: Self-pay | Admitting: Gastroenterology

## 2022-01-30 ENCOUNTER — Encounter: Payer: Self-pay | Admitting: *Deleted

## 2022-02-28 ENCOUNTER — Encounter: Payer: Self-pay | Admitting: Internal Medicine

## 2022-04-30 ENCOUNTER — Encounter: Payer: Self-pay | Admitting: *Deleted

## 2022-05-15 ENCOUNTER — Encounter: Payer: Self-pay | Admitting: *Deleted

## 2022-05-15 NOTE — Patient Instructions (Addendum)
Referring MD/PCP: Abran Richard  Procedure: Colonoscopy  Has patient had this procedure before?  Yes 01/08/17, Dr. Oneida Alar  If so, when, by whom and where?    Is there a family history of colon cancer?  no  Who?  What age when diagnosed?    Is patient diabetic? If yes, Type 1 or Type 2   no      Does patient have prosthetic heart valve or mechanical valve?  no  Do you have a pacemaker/defibrillator?  no  Has patient ever had endocarditis/atrial fibrillation? no  Does patient use oxygen/cpap? yes  Has patient had joint replacement within last 12 months?  no  Is patient constipated or do they take laxatives? no  Does patient have a history of alcohol/drug use?  no  Have you had a stroke/heart attack last 6 mths? no  Do you take medicine for weight loss?  no  For female patients,: have you had a hysterectomy no                      are you post menopausal yes                      do you still have your menstrual cycle no  Is patient on blood thinner such as Coumadin, Plavix and/or Aspirin? no  Medications:  Current Outpatient Medications on File Prior to Visit  Medication Sig Dispense Refill   benazepril (LOTENSIN) 20 MG tablet Take 20 mg by mouth daily.  1   Calcium Carb-Cholecalciferol (CALCIUM/VITAMIN D PO) Take 1 tablet by mouth 2 (two) times daily. 1200-800     cetirizine (ZYRTEC) 10 MG tablet Take 10 mg by mouth daily.     cholecalciferol (VITAMIN D) 1000 units tablet Take 2,000 Units by mouth daily.     fluticasone-salmeterol (ADVAIR) 250-50 MCG/ACT AEPB Inhale 1 puff into the lungs in the morning and at bedtime.     hydrochlorothiazide (HYDRODIURIL) 25 MG tablet Take 25 mg by mouth daily.  0   levothyroxine (SYNTHROID, LEVOTHROID) 75 MCG tablet Take 75 mcg by mouth daily before breakfast.   0   omeprazole (PRILOSEC) 20 MG capsule Take 20 mg by mouth daily.     pravastatin (PRAVACHOL) 20 MG tablet Take 20 mg by mouth at bedtime.   0   No current facility-administered  medications on file prior to visit.    Allergies:  Allergies  Allergen Reactions   Penicillins Hives, Shortness Of Breath and Rash    Has patient had a PCN reaction causing immediate rash, facial/tongue/throat swelling, SOB or lightheadedness with hypotension:Yes Has patient had a PCN reaction causing severe rash involving mucus membranes or skin necrosis:unsure Has patient had a PCN reaction that required hospitalization:Treated in ER Has patient had a PCN reaction occurring within the last 10 years:unsure If all of the above answers are "NO", then may proceed with Cephalosporin use.      Estimated body mass index is 51.69 kg/m as calculated from the following:   Height as of 04/24/18: '4\' 11"'$  (1.499 m).   Weight as of 10/14/18: 255 lb 14.4 oz (116.1 kg).

## 2022-05-16 ENCOUNTER — Ambulatory Visit: Payer: Medicaid Other

## 2022-05-17 NOTE — Progress Notes (Signed)
Called pt. She is going to check her spouse schedule and call me back

## 2022-05-17 NOTE — Progress Notes (Signed)
Four adenomas in March 2018. She is overdue for surveillance. ASA 3.

## 2022-05-29 ENCOUNTER — Encounter: Payer: Self-pay | Admitting: *Deleted

## 2024-09-09 ENCOUNTER — Encounter (INDEPENDENT_AMBULATORY_CARE_PROVIDER_SITE_OTHER): Payer: Self-pay | Admitting: *Deleted

## 2024-09-09 ENCOUNTER — Encounter (HOSPITAL_COMMUNITY): Payer: Self-pay | Admitting: Adult Health
